# Patient Record
Sex: Male | Born: 1940 | Hispanic: No | Marital: Married | Smoking: Former smoker
Health system: Southern US, Community
[De-identification: ages and names within clinical notes are randomized; demographics above are authoritative.]

## PROBLEM LIST (undated history)

## (undated) DIAGNOSIS — I10 Essential (primary) hypertension: Secondary | ICD-10-CM

## (undated) DIAGNOSIS — K219 Gastro-esophageal reflux disease without esophagitis: Secondary | ICD-10-CM

## (undated) DIAGNOSIS — E079 Disorder of thyroid, unspecified: Secondary | ICD-10-CM

## (undated) DIAGNOSIS — F419 Anxiety disorder, unspecified: Secondary | ICD-10-CM

## (undated) DIAGNOSIS — E119 Type 2 diabetes mellitus without complications: Secondary | ICD-10-CM

## (undated) DIAGNOSIS — I2510 Atherosclerotic heart disease of native coronary artery without angina pectoris: Secondary | ICD-10-CM

## (undated) DIAGNOSIS — IMO0001 Reserved for inherently not codable concepts without codable children: Secondary | ICD-10-CM

## (undated) HISTORY — DX: Gastro-esophageal reflux disease without esophagitis: K21.9

## (undated) HISTORY — DX: Type 2 diabetes mellitus without complications: E11.9

## (undated) HISTORY — DX: Reserved for inherently not codable concepts without codable children: IMO0001

## (undated) HISTORY — DX: Anxiety disorder, unspecified: F41.9

## (undated) HISTORY — PX: CORONARY ANGIOPLASTY: SHX604

---

## 1998-10-30 HISTORY — PX: CHOLECYSTECTOMY: SHX55

## 2006-05-17 ENCOUNTER — Ambulatory Visit: Payer: Self-pay | Admitting: Gastroenterology

## 2006-08-07 ENCOUNTER — Ambulatory Visit: Payer: Self-pay | Admitting: Nurse Practitioner

## 2006-08-08 ENCOUNTER — Ambulatory Visit: Payer: Self-pay | Admitting: Gastroenterology

## 2007-01-14 ENCOUNTER — Other Ambulatory Visit: Payer: Self-pay

## 2007-01-14 ENCOUNTER — Inpatient Hospital Stay: Payer: Self-pay | Admitting: Cardiology

## 2007-03-27 ENCOUNTER — Ambulatory Visit: Payer: Self-pay | Admitting: Internal Medicine

## 2009-03-08 ENCOUNTER — Ambulatory Visit: Payer: Self-pay | Admitting: Gastroenterology

## 2009-09-02 ENCOUNTER — Ambulatory Visit: Payer: Self-pay | Admitting: Ophthalmology

## 2009-09-02 ENCOUNTER — Ambulatory Visit: Payer: Self-pay | Admitting: Cardiology

## 2009-09-14 ENCOUNTER — Ambulatory Visit: Payer: Self-pay | Admitting: Ophthalmology

## 2009-12-08 ENCOUNTER — Ambulatory Visit: Payer: Self-pay | Admitting: Ophthalmology

## 2009-12-20 ENCOUNTER — Ambulatory Visit: Payer: Self-pay | Admitting: Ophthalmology

## 2010-06-03 ENCOUNTER — Emergency Department: Payer: Self-pay | Admitting: Emergency Medicine

## 2013-07-09 ENCOUNTER — Ambulatory Visit: Payer: Self-pay | Admitting: Cardiology

## 2015-04-28 ENCOUNTER — Encounter: Payer: Self-pay | Admitting: Urology

## 2015-04-28 ENCOUNTER — Ambulatory Visit (INDEPENDENT_AMBULATORY_CARE_PROVIDER_SITE_OTHER): Payer: Medicare Other | Admitting: Urology

## 2015-04-28 VITALS — BP 113/58 | HR 67 | Resp 18 | Ht 76.0 in | Wt 191.3 lb

## 2015-04-28 DIAGNOSIS — N400 Enlarged prostate without lower urinary tract symptoms: Secondary | ICD-10-CM

## 2015-04-28 LAB — URINALYSIS, COMPLETE
Bilirubin, UA: NEGATIVE
LEUKOCYTES UA: NEGATIVE
Nitrite, UA: NEGATIVE
RBC, UA: NEGATIVE
SPEC GRAV UA: 1.02 (ref 1.005–1.030)
UUROB: 0.2 mg/dL (ref 0.2–1.0)
pH, UA: 5 (ref 5.0–7.5)

## 2015-04-28 LAB — MICROSCOPIC EXAMINATION
BACTERIA UA: NONE SEEN
RBC MICROSCOPIC, UA: NONE SEEN /HPF (ref 0–?)

## 2015-04-28 LAB — BLADDER SCAN AMB NON-IMAGING

## 2015-04-28 NOTE — Progress Notes (Signed)
Urology History and Physical Exam  CC: Difficulty urinating  HPI: 74 year old male presents w/ c/o LUTS, ED. He has nocturia x 2, intermittency, decreased FOS, hesitancy and feeling of incomplete emptying. No prior h/o UTI, treatment for BPH, blood in urine. This is his 1st GU visit. He also has ED--last erection 1995. He tried PDE%I's years ago w/o success.  PMH: Past Medical History  Diagnosis Date  . Anxiety   . Diabetes mellitus without complication   . Reflux     PSH: Past Surgical History  Procedure Laterality Date  . Cholecystectomy  2000    Allergies: No Known Allergies  Medications:  (Not in a hospital admission)   Social History: History   Social History  . Marital Status: Married    Spouse Name: N/A  . Number of Children: N/A  . Years of Education: N/A   Occupational History  . Not on file.   Social History Main Topics  . Smoking status: Former Smoker -- 30 years    Types: Cigarettes  . Smokeless tobacco: Not on file  . Alcohol Use: No  . Drug Use: Yes  . Sexual Activity: Not on file   Other Topics Concern  . Not on file   Social History Narrative  . No narrative on file    Family History: Family History  Problem Relation Age of Onset  . Prostate cancer Brother   . Prostate cancer Brother     Review of Systems: Positive: Urinary frequency,urgency, nocturia, incontinence, intermittency,ED, diarrhea, wt loss, anxiety Negative:   A further 10 point review of systems was negative except what is listed in the HPI.                  Physical Exam: Filed Vitals:   04/28/15 1605  Height: 6\' 4"  (1.93 m)  Weight: 191 lb 4.8 oz (86.773 kg)   General: No acute distress.  Awake. Head:  Normocephalic.  Atraumatic. ENT:  EOMI.  Mucous membranes moist Neck:  Supple.  No lymphadenopathy. Abdomen: Soft.  Non tender to palpation. Umbilical hernia Skin:  Normal turgor.  No visible rash. Extremity: No gross deformity of bilateral upper extremities.   No gross deformity of                             lower extremities. Neurologic: Alert. Appropriate mood.  Penis:  Uncircumcised.  No lesions. Urethra: Orthotopic meatus. Scrotum: No lesions.  No ecchymosis.  No erythema. Testicles: Descended bilaterally.  No masses bilaterally.Atrophic Epididymis: Palpable bilaterally. Nontender to palpation.  Studies:  Urinalysis is clear  IPSS--9  QOL score 3  PV U/S volume 15 cc  Assessment:  1. BPH--moderately symptomatic--he empties well  2. 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 I have reassured pt about his exam.   2. I suggested that with his multiple meds he consider no symptomatic mgmt of voiding symptome=s--he agrees w/ this  3. PSA today --he has family h/o PCA  4. OV here in 1 year

## 2015-04-29 LAB — PSA: Prostate Specific Ag, Serum: 2.1 ng/mL (ref 0.0–4.0)

## 2015-05-04 ENCOUNTER — Ambulatory Visit
Admission: RE | Admit: 2015-05-04 | Discharge: 2015-05-04 | Disposition: A | Discharge status: Home / Self Care | Payer: Medicare Other | Source: Ambulatory Visit | Attending: Family Medicine | Admitting: Family Medicine

## 2015-05-04 ENCOUNTER — Other Ambulatory Visit: Payer: Self-pay | Admitting: Family Medicine

## 2015-05-04 DIAGNOSIS — J9811 Atelectasis: Secondary | ICD-10-CM | POA: Diagnosis not present

## 2015-05-04 DIAGNOSIS — R634 Abnormal weight loss: Secondary | ICD-10-CM

## 2015-05-11 ENCOUNTER — Other Ambulatory Visit: Payer: Self-pay | Admitting: Family Medicine

## 2015-05-11 DIAGNOSIS — IMO0002 Reserved for concepts with insufficient information to code with codable children: Secondary | ICD-10-CM

## 2015-05-11 DIAGNOSIS — R634 Abnormal weight loss: Secondary | ICD-10-CM

## 2015-05-14 ENCOUNTER — Ambulatory Visit
Admission: RE | Admit: 2015-05-14 | Discharge: 2015-05-14 | Disposition: A | Discharge status: Home / Self Care | Payer: Medicare Other | Source: Ambulatory Visit | Attending: Family Medicine | Admitting: Family Medicine

## 2015-05-14 DIAGNOSIS — E119 Type 2 diabetes mellitus without complications: Secondary | ICD-10-CM | POA: Insufficient documentation

## 2015-05-14 DIAGNOSIS — K861 Other chronic pancreatitis: Secondary | ICD-10-CM | POA: Diagnosis not present

## 2015-05-14 DIAGNOSIS — E0590 Thyrotoxicosis, unspecified without thyrotoxic crisis or storm: Secondary | ICD-10-CM | POA: Insufficient documentation

## 2015-05-14 DIAGNOSIS — R918 Other nonspecific abnormal finding of lung field: Secondary | ICD-10-CM | POA: Insufficient documentation

## 2015-05-14 DIAGNOSIS — R634 Abnormal weight loss: Secondary | ICD-10-CM | POA: Diagnosis present

## 2015-05-14 DIAGNOSIS — Z9861 Coronary angioplasty status: Secondary | ICD-10-CM | POA: Diagnosis not present

## 2015-05-14 DIAGNOSIS — IMO0002 Reserved for concepts with insufficient information to code with codable children: Secondary | ICD-10-CM

## 2015-05-14 DIAGNOSIS — I2510 Atherosclerotic heart disease of native coronary artery without angina pectoris: Secondary | ICD-10-CM | POA: Insufficient documentation

## 2015-05-14 MED ORDER — IOHEXOL 300 MG/ML  SOLN
75.0000 mL | Freq: Once | INTRAMUSCULAR | Status: AC | PRN
  Administered 2015-05-14: 75 mL via INTRAVENOUS

## 2015-06-04 ENCOUNTER — Other Ambulatory Visit: Payer: Self-pay | Admitting: Family Medicine

## 2015-06-04 DIAGNOSIS — E049 Nontoxic goiter, unspecified: Secondary | ICD-10-CM

## 2015-06-10 ENCOUNTER — Ambulatory Visit
Admission: RE | Admit: 2015-06-10 | Discharge: 2015-06-10 | Disposition: A | Discharge status: Home / Self Care | Payer: Medicare Other | Source: Ambulatory Visit | Attending: Family Medicine | Admitting: Family Medicine

## 2015-06-10 DIAGNOSIS — E049 Nontoxic goiter, unspecified: Secondary | ICD-10-CM | POA: Insufficient documentation

## 2015-06-29 ENCOUNTER — Ambulatory Visit: Payer: Medicare Other | Admitting: Certified Registered Nurse Anesthetist

## 2015-06-29 ENCOUNTER — Encounter
Admission: RE | Disposition: A | Discharge status: Home Health | Payer: Self-pay | Source: Ambulatory Visit | Attending: Gastroenterology

## 2015-06-29 ENCOUNTER — Encounter: Payer: Self-pay | Admitting: *Deleted

## 2015-06-29 ENCOUNTER — Ambulatory Visit
Admission: RE | Admit: 2015-06-29 | Discharge: 2015-06-29 | Disposition: A | Discharge status: Home Health | Payer: Medicare Other | Source: Ambulatory Visit | Attending: Gastroenterology | Admitting: Gastroenterology

## 2015-06-29 DIAGNOSIS — I10 Essential (primary) hypertension: Secondary | ICD-10-CM | POA: Insufficient documentation

## 2015-06-29 DIAGNOSIS — Z7982 Long term (current) use of aspirin: Secondary | ICD-10-CM | POA: Insufficient documentation

## 2015-06-29 DIAGNOSIS — K5730 Diverticulosis of large intestine without perforation or abscess without bleeding: Secondary | ICD-10-CM | POA: Insufficient documentation

## 2015-06-29 DIAGNOSIS — E109 Type 1 diabetes mellitus without complications: Secondary | ICD-10-CM | POA: Insufficient documentation

## 2015-06-29 DIAGNOSIS — Z86010 Personal history of colonic polyps: Secondary | ICD-10-CM | POA: Diagnosis not present

## 2015-06-29 DIAGNOSIS — I2510 Atherosclerotic heart disease of native coronary artery without angina pectoris: Secondary | ICD-10-CM | POA: Diagnosis not present

## 2015-06-29 DIAGNOSIS — Z794 Long term (current) use of insulin: Secondary | ICD-10-CM | POA: Insufficient documentation

## 2015-06-29 DIAGNOSIS — B3781 Candidal esophagitis: Secondary | ICD-10-CM | POA: Diagnosis not present

## 2015-06-29 DIAGNOSIS — K319 Disease of stomach and duodenum, unspecified: Secondary | ICD-10-CM | POA: Diagnosis not present

## 2015-06-29 DIAGNOSIS — R634 Abnormal weight loss: Secondary | ICD-10-CM | POA: Insufficient documentation

## 2015-06-29 DIAGNOSIS — F419 Anxiety disorder, unspecified: Secondary | ICD-10-CM | POA: Insufficient documentation

## 2015-06-29 DIAGNOSIS — D120 Benign neoplasm of cecum: Secondary | ICD-10-CM | POA: Insufficient documentation

## 2015-06-29 DIAGNOSIS — Z87891 Personal history of nicotine dependence: Secondary | ICD-10-CM | POA: Insufficient documentation

## 2015-06-29 DIAGNOSIS — K529 Noninfective gastroenteritis and colitis, unspecified: Secondary | ICD-10-CM | POA: Diagnosis not present

## 2015-06-29 DIAGNOSIS — K210 Gastro-esophageal reflux disease with esophagitis: Secondary | ICD-10-CM | POA: Diagnosis not present

## 2015-06-29 DIAGNOSIS — K621 Rectal polyp: Secondary | ICD-10-CM | POA: Insufficient documentation

## 2015-06-29 DIAGNOSIS — K2270 Barrett's esophagus without dysplasia: Secondary | ICD-10-CM | POA: Diagnosis present

## 2015-06-29 DIAGNOSIS — K635 Polyp of colon: Secondary | ICD-10-CM | POA: Insufficient documentation

## 2015-06-29 HISTORY — PX: COLONOSCOPY WITH PROPOFOL: SHX5780

## 2015-06-29 HISTORY — PX: ESOPHAGOGASTRODUODENOSCOPY (EGD) WITH PROPOFOL: SHX5813

## 2015-06-29 LAB — KOH PREP

## 2015-06-29 LAB — GLUCOSE, CAPILLARY: Glucose-Capillary: 131 mg/dL — ABNORMAL HIGH (ref 65–99)

## 2015-06-29 SURGERY — ESOPHAGOGASTRODUODENOSCOPY (EGD) WITH PROPOFOL
Anesthesia: General

## 2015-06-29 MED ORDER — SODIUM CHLORIDE 0.9 % IV SOLN: INTRAVENOUS | Status: DC

## 2015-06-29 MED ORDER — LIDOCAINE HCL (CARDIAC) 20 MG/ML IV SOLN
INTRAVENOUS | Status: DC | PRN
  Administered 2015-06-29: 20 mg via INTRAVENOUS

## 2015-06-29 MED ORDER — PROPOFOL INFUSION 10 MG/ML OPTIME
INTRAVENOUS | Status: DC | PRN
  Administered 2015-06-29: 150 ug/kg/min via INTRAVENOUS

## 2015-06-29 MED ORDER — EPHEDRINE SULFATE 50 MG/ML IJ SOLN
INTRAMUSCULAR | Status: DC | PRN
  Administered 2015-06-29 (×2): 5 mg via INTRAVENOUS

## 2015-06-29 MED ORDER — SODIUM CHLORIDE 0.9 % IV SOLN
INTRAVENOUS | Status: DC
  Administered 2015-06-29: 10:00:00 via INTRAVENOUS

## 2015-06-29 MED ORDER — GLYCOPYRROLATE 0.2 MG/ML IJ SOLN
INTRAMUSCULAR | Status: DC | PRN
  Administered 2015-06-29 (×2): 0.1 mg via INTRAVENOUS

## 2015-06-29 MED ORDER — PHENYLEPHRINE HCL 10 MG/ML IJ SOLN
INTRAMUSCULAR | Status: DC | PRN
  Administered 2015-06-29: 50 ug via INTRAVENOUS

## 2015-06-29 NOTE — Anesthesia Postprocedure Evaluation (Signed)
  Anesthesia Post-op Note  Patient: Jorge Bradford  Procedure(s) Performed: Procedure(s): ESOPHAGOGASTRODUODENOSCOPY (EGD) WITH PROPOFOL (N/A) COLONOSCOPY WITH PROPOFOL (N/A)  Anesthesia type:General  Patient location: PACU  Post pain: Pain level controlled  Post assessment: Post-op Vital signs reviewed, Patient's Cardiovascular Status Stable, Respiratory Function Stable, Patent Airway and No signs of Nausea or vomiting  Post vital signs: Reviewed and stable  Last Vitals:  Filed Vitals:   06/29/15 0941  BP: 118/61  Pulse: 68  Temp: 36.7 C  Resp: 14    Level of consciousness: awake, alert  and patient cooperative  Complications: No apparent anesthesia complications

## 2015-06-29 NOTE — H&P (Signed)
Outpatient short stay form Pre-procedure 06/29/2015 10:53 AM Lollie Sails MD  Primary Physician: Dr. Veda Canning  Reason for visit:  EGD and colonoscopy  History of present illness:  Patient is a heavy 74-year-old male presenting for UA showed regards to abnormal weight loss. He also has a personal history of Barrett's esophagus. His had no GI symptoms. Does have a personal history of adenomatous colon polyps as well.  He takes no anticoagulation medications for the exception of a 81 mg aspirin daily. He takes no over-the-counter or aspirin products otherwise.    Current facility-administered medications:  .  0.9 %  sodium chloride infusion, , Intravenous, Continuous, Lollie Sails, MD, Last Rate: 20 mL/hr at 06/29/15 1008 .  0.9 %  sodium chloride infusion, , Intravenous, Continuous, Lollie Sails, MD  Prescriptions prior to admission  Medication Sig Dispense Refill Last Dose  . aspirin EC 81 MG tablet Take 81 mg by mouth daily.   06/28/2015 at Unknown time  . enalapril (VASOTEC) 10 MG tablet Take 10 mg by mouth daily.    06/28/2015 at Unknown time  . furosemide (LASIX) 20 MG tablet    06/28/2015 at Unknown time  . gabapentin (NEURONTIN) 600 MG tablet    06/28/2015 at Unknown time  . lovastatin (MEVACOR) 40 MG tablet    06/28/2015 at Unknown time  . metFORMIN (GLUCOPHAGE-XR) 500 MG 24 hr tablet    06/28/2015 at Unknown time  . metoprolol succinate (TOPROL-XL) 25 MG 24 hr tablet    06/29/2015 at 0600  . omeprazole (PRILOSEC) 20 MG capsule    06/28/2015 at Unknown time     No Known Allergies   Past Medical History  Diagnosis Date  . Anxiety   . Diabetes mellitus without complication   . Reflux     Review of systems:      Physical Exam    Heart and lungs: Regular rate and rhythm without rub or gallop, lungs are bilaterally clear    HEENT: Normocephalic atraumatic eyes are anicteric    Other:     Pertinant exam for procedure: Soft nontender nondistended bowel  sounds positive normoactive. There is some mild discomfort to palpation in the left lower quadrant. No rebound    Planned proceedures: EGD and colonoscopy with indicated procedures I have discussed the risks benefits and complications of procedures to include not limited to bleeding, infection, perforation and the risk of sedation and the patient wishes to proceed.    Lollie Sails, MD Gastroenterology 06/29/2015  10:53 AM

## 2015-06-29 NOTE — Anesthesia Postprocedure Evaluation (Signed)
  Anesthesia Post-op Note  Patient: Jorge Bradford  Procedure(s) Performed: Procedure(s): ESOPHAGOGASTRODUODENOSCOPY (EGD) WITH PROPOFOL (N/A) COLONOSCOPY WITH PROPOFOL (N/A)  Anesthesia type:General  Patient location: PACU  Post pain: Pain level controlled  Post assessment: Post-op Vital signs reviewed, Patient's Cardiovascular Status Stable, Respiratory Function Stable, Patent Airway and No signs of Nausea or vomiting  Post vital signs: Reviewed and stable  Last Vitals:  Filed Vitals:   06/29/15 1224  BP: 130/75  Pulse: 77  Temp:   Resp: 14    Level of consciousness: awake, alert  and patient cooperative  Complications: No apparent anesthesia complications

## 2015-06-29 NOTE — Op Note (Signed)
[facility] Gastroenterology Patient Name: Jorge Bradford Procedure Date: 06/29/2015 10:54 AM MRN: [id number] Account #: [id number] Date of Birth: September 05, 1941 Admit Type: Outpatient Age: 74 Room: [facility] ENDO ROOM 3 Gender: Male Note Status: Finalized Procedure:         Colonoscopy Indications:       Personal history of colonic polyps, Weight loss Providers:         Lollie Sails, MD Referring MD:      Denton Lank, MD (Referring MD) Medicines:         Monitored Anesthesia Care Complications:     No immediate complications. Procedure:         Pre-Anesthesia Assessment:                    - ASA Grade Assessment: III - A patient with severe                     systemic disease.                    After obtaining informed consent, the colonoscope was                     passed under direct vision. Throughout the procedure, the                     patient's blood pressure, pulse, and oxygen saturations                     were monitored continuously. The Colonoscope was                     introduced through the anus and advanced to the the cecum,                     identified by appendiceal orifice and ileocecal valve. The                     colonoscopy was unusually difficult due to poor bowel prep                     and a tortuous colon. Successful completion of the                     procedure was aided by changing the patient to a supine                     position, changing the patient to a prone position and                     lavage. The patient tolerated the procedure well. The                     quality of the bowel preparation was fair. Findings:      A 2 mm polyp was found in the descending colon. The polyp was sessile.       The polyp was removed with a cold biopsy forceps. Resection and       retrieval were complete.      A 2 mm polyp was found in the transverse colon. The polyp was sessile.       The polyp was removed with a cold biopsy forceps.  Resection and       retrieval were complete.  A 3 mm polyp was found in the cecum. The polyp was sessile. The polyp       was removed with a cold biopsy forceps. Resection and retrieval were       complete.      A 3 mm polyp was found in the descending colon. The polyp was sessile.       The polyp was removed with a cold biopsy forceps. Resection and       retrieval were complete.      A 3 mm polyp was found in the rectum. The polyp was sessile. The polyp       was removed with a cold biopsy forceps. Resection and retrieval were       complete.      Multiple medium-mouthed diverticula were found in the sigmoid colon and       in the distal descending colon.      The digital rectal exam was normal. Impression:        - One 2 mm polyp in the descending colon. Resected and                     retrieved.                    - One 2 mm polyp in the transverse colon. Resected and                     retrieved.                    - One 3 mm polyp in the cecum. Resected and retrieved.                    - One 3 mm polyp in the descending colon. Resected and                     retrieved.                    - One 3 mm polyp in the rectum. Resected and retrieved.                    - Diverticulosis in the sigmoid colon and in the distal                     descending colon. Recommendation:    - Await pathology results.                    - Telephone GI clinic for pathology results in 1 week. Procedure Code(s): --- Professional ---                    [phone], Colonoscopy, flexible; with biopsy, single or                     multiple Diagnosis Code(s): --- Professional ---                    211.3, Benign neoplasm of colon                    569.0, Anal and rectal polyp                    V12.72, Personal history of colonic polyps  783.21, Loss of weight                    562.10, Diverticulosis of colon (without mention of                     hemorrhage) CPT copyright 2014  American Medical Association. All rights reserved. The codes documented in this report are preliminary and upon coder review may  be revised to meet current compliance requirements. Lollie Sails, MD 06/29/2015 12:10:02 PM This report has been signed electronically. Number of Addenda: 0 Note Initiated On: 06/29/2015 10:54 AM Scope Withdrawal Time: 0 hours 13 minutes 2 seconds  Total Procedure Duration: 0 hours 30 minutes 39 seconds       [facility]

## 2015-06-29 NOTE — Transfer of Care (Signed)
Immediate Anesthesia Transfer of Care Note  Patient: Jorge Bradford  Procedure(s) Performed: Procedure(s): ESOPHAGOGASTRODUODENOSCOPY (EGD) WITH PROPOFOL (N/A) COLONOSCOPY WITH PROPOFOL (N/A)  Patient Location: PACU  Anesthesia Type:General  Level of Consciousness: awake  Airway & Oxygen Therapy: Patient Spontanous Breathing and Patient connected to nasal cannula oxygen  Post-op Assessment: Report given to RN  Post vital signs: stable  Last Vitals:  Filed Vitals:   06/29/15 0941  BP: 118/61  Pulse: 68  Temp: 36.7 C  Resp: 14    Complications: No apparent anesthesia complications

## 2015-06-29 NOTE — Anesthesia Preprocedure Evaluation (Signed)
Anesthesia Evaluation  Patient identified by MRN, date of birth, ID band Patient awake    Reviewed: Allergy & Precautions, NPO status , Patient's Chart, lab work & pertinent test results  History of Anesthesia Complications Negative for: history of anesthetic complications  Airway Mallampati: II       Dental   Pulmonary former smoker,    + decreased breath sounds      Cardiovascular hypertension, Pt. on home beta blockers + CAD Normal cardiovascular exam    Neuro/Psych Anxiety    GI/Hepatic negative GI ROS, Neg liver ROS,   Endo/Other  diabetes, Well Controlled, Type 1, Insulin Dependent  Renal/GU negative Renal ROS     Musculoskeletal   Abdominal Normal abdominal exam  (+)   Peds  Hematology   Anesthesia Other Findings   Reproductive/Obstetrics                             Anesthesia Physical Anesthesia Plan  ASA: III  Anesthesia Plan: General   Post-op Pain Management:    Induction: Intravenous  Airway Management Planned: Nasal Cannula  Additional Equipment:   Intra-op Plan:   Post-operative Plan:   Informed Consent: I have reviewed the patients History and Physical, chart, labs and discussed the procedure including the risks, benefits and alternatives for the proposed anesthesia with the patient or authorized representative who has indicated his/her understanding and acceptance.     Plan Discussed with: CRNA  Anesthesia Plan Comments:         Anesthesia Quick Evaluation

## 2015-06-29 NOTE — Op Note (Signed)
[facility] Gastroenterology Patient Name: Jorge Bradford Procedure Date: 06/29/2015 10:54 AM MRN: [id number] Account #: [id number] Date of Birth: 10/22/1941 Admit Type: Outpatient Age: 74 Room: [facility] ENDO ROOM 3 Gender: Male Note Status: Finalized Procedure:         Upper GI endoscopy Indications:       Follow-up of Barrett's esophagus, Weight loss Providers:         Lollie Sails, MD Referring MD:      Denton Lank, MD (Referring MD) Medicines:         Monitored Anesthesia Care Complications:     No immediate complications. Procedure:         Pre-Anesthesia Assessment:                    - ASA Grade Assessment: III - A patient with severe                     systemic disease.                    After obtaining informed consent, the endoscope was passed                     under direct vision. Throughout the procedure, the                     patient's blood pressure, pulse, and oxygen saturations                     were monitored continuously. The Endoscope was introduced                     through the mouth, and advanced to the third part of                     duodenum. The patient tolerated the procedure well. The                     upper GI endoscopy was accomplished without difficulty. Findings:      The Z-line was irregular.      There were esophageal mucosal changes secondary to established       short-segment Barrett's disease present at the gastroesophageal       junction. The maximum longitudinal extent of these mucosal changes was 1       cm in length. Mucosa was biopsied with a cold forceps for histology in 4       quadrants.      Possible minimal candidiasis was found in the lower third of the       esophagus. Cells for cytology were obtained by brushing.      The cardia and gastric fundus were normal on retroflexion.      The examined duodenum was normal.      A single 6 mm pedunculated polyp with no bleeding and no stigmata of       recent  bleeding was found on the anterior wall of the gastric body. The       polyp was removed with a cold biopsy forceps. Resection and retrieval       were complete.      A few dispersed, small non-bleeding erosions were found in the       prepyloric region of the stomach. There were no stigmata of recent       bleeding. Biopsies  were taken with a cold forceps for histology. Impression:        - Z-line irregular.                    - Esophageal mucosal changes secondary to established                     short-segment Barrett's disease. Biopsied.                    - Monilial esophagitis. Cells for cytology obtained.                    - Normal examined duodenum. Recommendation:    - Await pathology results.                    - Use Prilosec (omeprazole) 20 mg PO daily daily. Procedure Code(s): --- Professional ---                    [phone], Esophagogastroduodenoscopy, flexible, transoral;                     with biopsy, single or multiple Diagnosis Code(s): --- Professional ---                    530.89, Other specified disorders of esophagus                    530.85, Barrett's esophagus                    112.84, Candidal esophagitis                    783.21, Loss of weight CPT copyright 2014 American Medical Association. All rights reserved. The codes documented in this report are preliminary and upon coder review may  be revised to meet current compliance requirements. Lollie Sails, MD 06/29/2015 11:32:47 AM This report has been signed electronically. Number of Addenda: 0 Note Initiated On: 06/29/2015 10:54 AM      [facility]

## 2015-07-01 LAB — SURGICAL PATHOLOGY

## 2015-07-07 ENCOUNTER — Other Ambulatory Visit: Payer: Self-pay | Admitting: Otolaryngology

## 2015-07-07 DIAGNOSIS — E041 Nontoxic single thyroid nodule: Secondary | ICD-10-CM

## 2015-07-08 ENCOUNTER — Other Ambulatory Visit: Payer: Self-pay | Admitting: Radiology

## 2015-07-09 ENCOUNTER — Ambulatory Visit
Admission: RE | Admit: 2015-07-09 | Discharge: 2015-07-09 | Disposition: A | Discharge status: Home / Self Care | Payer: Medicare Other | Source: Ambulatory Visit | Attending: Otolaryngology | Admitting: Otolaryngology

## 2015-07-09 DIAGNOSIS — E041 Nontoxic single thyroid nodule: Secondary | ICD-10-CM | POA: Diagnosis present

## 2015-07-09 HISTORY — DX: Essential (primary) hypertension: I10

## 2015-07-09 HISTORY — DX: Disorder of thyroid, unspecified: E07.9

## 2015-07-09 HISTORY — DX: Atherosclerotic heart disease of native coronary artery without angina pectoris: I25.10

## 2015-07-09 NOTE — Procedures (Signed)
U/S Thyroid Biopsy  Complications:  None  Blood Loss: none  See dictation in canopy pacs

## 2015-07-09 NOTE — Discharge Instructions (Signed)
Thyroid Biopsy °The thyroid gland is a butterfly-shaped gland situated in the front of the neck. It produces hormones which affect metabolism, growth and development, and body temperature. A thyroid biopsy is a procedure in which small samples of tissue or fluid are removed from the thyroid gland or mass and examined under a microscope. This test is done to determine the cause of thyroid problems, such as infection, cancer, or other thyroid problems. °There are 2 ways to obtain samples: °1. Fine needle biopsy. Samples are removed using a thin needle inserted through the skin and into the thyroid gland or mass. °2. Open biopsy. Samples are removed after a cut (incision) is made through the skin. °LET YOUR CAREGIVER KNOW ABOUT:  °· Allergies. °· Medications taken including herbs, eye drops, over-the-counter medications, and creams. °· Use of steroids (by mouth or creams). °· Previous problems with anesthetics or numbing medicine. °· Possibility of pregnancy, if this applies. °· History of blood clots (thrombophlebitis). °· History of bleeding or blood problems. °· Previous surgery. °· Other health problems. °RISKS AND COMPLICATIONS °· Bleeding from the site. The risk of bleeding is higher if you have a bleeding disorder or are taking any blood thinning medications (anticoagulants). °· Infection. °· Injury to structures near the thyroid gland. °BEFORE THE PROCEDURE  °This is a procedure that can be done as an outpatient. Confirm the time that you need to arrive for your procedure. Confirm whether there is a need to fast or withhold any medications. A blood sample may be done to determine your blood clotting time. Medicine may be given to help you relax (sedative). °PROCEDURE °Fine needle biopsy. °You will be awake during the procedure. You may be asked to lie on your back with your head tipped backward to extend your neck. Let your caregiver know if you cannot tolerate the positioning. An area on your neck will be  cleansed. A needle is inserted through the skin of your neck. You may feel a mild discomfort during this procedure. You may be asked to avoid coughing, talking, swallowing, or making sounds during some portions of the procedure. The needle is withdrawn once tissue or fluid samples have been removed. Pressure may be applied to the neck to reduce swelling and ensure that bleeding has stopped. The samples will be sent for examination.  °Open biopsy. °You will be given general anesthesia. You will be asleep during the procedure. An incision is made in your neck. A sample of thyroid tissue or the mass is removed. The tissue sample or mass will be sent for examination. The sample or mass may be examined during the biopsy. If the sample or mass contains cancer cells, some or all of the thyroid gland may be removed. The incision is closed with stitches. °AFTER THE PROCEDURE  °Your recovery will be assessed and monitored. If there are no problems, as an outpatient, you should be able to go home shortly after the procedure. °If you had a fine needle biopsy: °· You may have soreness at the biopsy site for 1 to 2 days. °If you had an open biopsy:  °· You may have soreness at the biopsy site for 3 to 4 days. °· You may have a hoarse voice or sore throat for 1 to 2 days. °Obtaining the Test Results °It is your responsibility to obtain your test results. Do not assume everything is normal if you have not heard from your caregiver or the medical facility. It is important for you to follow up   on all of your test results. °HOME CARE INSTRUCTIONS  °· Keeping your head raised on a pillow when you are lying down may ease biopsy site discomfort. °· Supporting the back of your head and neck with both hands as you sit up from a lying position may ease biopsy site discomfort. °· Only take over-the-counter or prescription medicines for pain, discomfort, or fever as directed by your caregiver. °· Throat lozenges or gargling with warm salt  water may help to soothe a sore throat. °SEEK IMMEDIATE MEDICAL CARE IF:  °· You have severe bleeding from the biopsy site. °· You have difficulty swallowing. °· You have a fever. °· You have increased pain, swelling, redness, or warmth at the biopsy site. °· You notice pus coming from the biopsy site. °· You have swollen glands (lymph nodes) in your neck. °Document Released: 08/13/2007 Document Revised: 02/10/2013 Document Reviewed: 01/08/2014 °[employer]® Patient Information ©2015 [employer]. This information is not intended to replace advice given to you by your health care provider. Make sure you discuss any questions you have with your health care provider. ° °

## 2015-07-12 LAB — CYTOLOGY - NON PAP

## 2015-09-30 ENCOUNTER — Other Ambulatory Visit: Payer: Self-pay | Admitting: *Deleted

## 2016-04-27 ENCOUNTER — Ambulatory Visit: Payer: Self-pay

## 2017-01-07 ENCOUNTER — Emergency Department
Admission: EM | Admit: 2017-01-07 | Discharge: 2017-01-07 | Disposition: A | Discharge status: Home / Self Care | Payer: Medicare Other | Attending: Emergency Medicine | Admitting: Emergency Medicine

## 2017-01-07 ENCOUNTER — Encounter: Payer: Self-pay | Admitting: Emergency Medicine

## 2017-01-07 DIAGNOSIS — E119 Type 2 diabetes mellitus without complications: Secondary | ICD-10-CM | POA: Diagnosis not present

## 2017-01-07 DIAGNOSIS — Z79899 Other long term (current) drug therapy: Secondary | ICD-10-CM | POA: Insufficient documentation

## 2017-01-07 DIAGNOSIS — R112 Nausea with vomiting, unspecified: Secondary | ICD-10-CM | POA: Diagnosis present

## 2017-01-07 DIAGNOSIS — Z794 Long term (current) use of insulin: Secondary | ICD-10-CM | POA: Diagnosis not present

## 2017-01-07 DIAGNOSIS — I2510 Atherosclerotic heart disease of native coronary artery without angina pectoris: Secondary | ICD-10-CM | POA: Diagnosis not present

## 2017-01-07 DIAGNOSIS — Z7982 Long term (current) use of aspirin: Secondary | ICD-10-CM | POA: Diagnosis not present

## 2017-01-07 DIAGNOSIS — I10 Essential (primary) hypertension: Secondary | ICD-10-CM | POA: Insufficient documentation

## 2017-01-07 DIAGNOSIS — K529 Noninfective gastroenteritis and colitis, unspecified: Secondary | ICD-10-CM

## 2017-01-07 DIAGNOSIS — Z87891 Personal history of nicotine dependence: Secondary | ICD-10-CM | POA: Insufficient documentation

## 2017-01-07 LAB — COMPREHENSIVE METABOLIC PANEL
ALBUMIN: 3.5 g/dL (ref 3.5–5.0)
ALT: 18 U/L (ref 17–63)
AST: 27 U/L (ref 15–41)
Alkaline Phosphatase: 60 U/L (ref 38–126)
Anion gap: 7 (ref 5–15)
BUN: 23 mg/dL — AB (ref 6–20)
CHLORIDE: 102 mmol/L (ref 101–111)
CO2: 26 mmol/L (ref 22–32)
CREATININE: 1.24 mg/dL (ref 0.61–1.24)
Calcium: 8.8 mg/dL — ABNORMAL LOW (ref 8.9–10.3)
GFR calc Af Amer: >60 mL/min (ref >60)
GFR, EST NON AFRICAN AMERICAN: 55 mL/min — AB (ref >60)
GLUCOSE: 319 mg/dL — AB (ref 65–99)
POTASSIUM: 4.4 mmol/L (ref 3.5–5.1)
SODIUM: 135 mmol/L (ref 135–145)
Total Bilirubin: 1 mg/dL (ref 0.3–1.2)
Total Protein: 6.2 g/dL — ABNORMAL LOW (ref 6.5–8.1)

## 2017-01-07 LAB — URINALYSIS, COMPLETE (UACMP) WITH MICROSCOPIC
BACTERIA UA: NONE SEEN
BILIRUBIN URINE: NEGATIVE
Glucose, UA: >=500 mg/dL — AB
Hgb urine dipstick: NEGATIVE
Ketones, ur: NEGATIVE mg/dL
Leukocytes, UA: NEGATIVE
Nitrite: NEGATIVE
PH: 5 (ref 5.0–8.0)
Protein, ur: NEGATIVE mg/dL
RBC / HPF: NONE SEEN RBC/hpf (ref 0–5)
SPECIFIC GRAVITY, URINE: 1.011 (ref 1.005–1.030)
SQUAMOUS EPITHELIAL / LPF: NONE SEEN

## 2017-01-07 LAB — CBC
HEMATOCRIT: 41.5 % (ref 40.0–52.0)
Hemoglobin: 14.2 g/dL (ref 13.0–18.0)
MCH: 30.6 pg (ref 26.0–34.0)
MCHC: 34.2 g/dL (ref 32.0–36.0)
MCV: 89.2 fL (ref 80.0–100.0)
PLATELETS: 231 10*3/uL (ref 150–440)
RBC: 4.65 MIL/uL (ref 4.40–5.90)
RDW: 14.9 % — ABNORMAL HIGH (ref 11.5–14.5)
WBC: 11.7 10*3/uL — ABNORMAL HIGH (ref 3.8–10.6)

## 2017-01-07 LAB — LIPASE, BLOOD

## 2017-01-07 MED ORDER — SODIUM CHLORIDE 0.9 % IV BOLUS (SEPSIS)
1000.0000 mL | Freq: Once | INTRAVENOUS | Status: AC
  Administered 2017-01-07: 1000 mL via INTRAVENOUS

## 2017-01-07 MED ORDER — ONDANSETRON 4 MG PO TBDP: 4.0000 mg | ORAL_TABLET | Freq: Three times a day (TID) | ORAL | 0 refills | Status: DC | PRN

## 2017-01-07 MED ORDER — ONDANSETRON HCL 4 MG/2ML IJ SOLN
4.0000 mg | Freq: Once | INTRAMUSCULAR | Status: AC
Start: 2017-01-07 — End: 2017-01-07
  Administered 2017-01-07: 4 mg via INTRAVENOUS
  Filled 2017-01-07: qty 2

## 2017-01-07 MED ORDER — LOPERAMIDE HCL 2 MG PO CAPS
4.0000 mg | ORAL_CAPSULE | Freq: Once | ORAL | Status: AC
  Administered 2017-01-07: 4 mg via ORAL
  Filled 2017-01-07: qty 2

## 2017-01-07 NOTE — ED Provider Notes (Signed)
[facility] Emergency Department Provider Note  Time seen: 8:51 AM  I have reviewed the triage vital signs and the nursing notes.   HISTORY  Chief Complaint Emesis and Diarrhea    HPI Jorge Bradford is a 76 y.o. male With a past medical history of anxiety, diabetes, hypertension, presents the emergency department with nausea vomiting and diarrhea. According to the patient is symptoms started approximately 36 hours ago. States he has not been able to keep down any liquids or food for the past 36 hours. Continues to have vomiting and diarrhea this morning so he came to the emergency department for evaluation. Denies any abdominal pain. Denies any fever.  Past Medical History:  Diagnosis Date  . Anxiety   . Coronary artery disease   . Diabetes mellitus without complication ([place])   . Hypertension   . Reflux   . Thyroid disease     There are no active problems to display for this patient.   Past Surgical History:  Procedure Laterality Date  . CHOLECYSTECTOMY  2000  . CHOLECYSTECTOMY    . COLONOSCOPY WITH PROPOFOL N/A 06/29/2015   Procedure: COLONOSCOPY WITH PROPOFOL;  Surgeon: Lollie Sails, MD;  Location: [facility] ENDOSCOPY;  Service: Endoscopy;  Laterality: N/A;  . CORONARY ANGIOPLASTY    . ESOPHAGOGASTRODUODENOSCOPY (EGD) WITH PROPOFOL N/A 06/29/2015   Procedure: ESOPHAGOGASTRODUODENOSCOPY (EGD) WITH PROPOFOL;  Surgeon: Lollie Sails, MD;  Location: [facility] ENDOSCOPY;  Service: Endoscopy;  Laterality: N/A;    Prior to Admission medications   Medication Sig Start Date End Date Taking? Authorizing Provider  aspirin EC 81 MG tablet Take 81 mg by mouth every evening.     Historical Provider, MD  enalapril (VASOTEC) 10 MG tablet Take 10 mg by mouth daily.  04/05/15   Historical Provider, MD  furosemide (LASIX) 20 MG tablet Take 20 mg by mouth daily.  02/18/15   Historical Provider, MD  gabapentin (NEURONTIN) 600 MG tablet Take 600 mg by mouth 2 (two) times  daily.  04/20/15   Historical Provider, MD  insulin detemir (LEVEMIR) 100 UNIT/ML injection Inject 27 Units into the skin 2 (two) times daily. 22U in the evening    Historical Provider, MD  lovastatin (MEVACOR) 40 MG tablet Take 40 mg by mouth at bedtime.  04/05/15   Historical Provider, MD  metFORMIN (GLUCOPHAGE-XR) 500 MG 24 hr tablet Take 500 mg by mouth daily with breakfast.  03/04/15   Historical Provider, MD  metoprolol succinate (TOPROL-XL) 25 MG 24 hr tablet Take 25 mg by mouth daily.  03/26/15   Historical Provider, MD  omeprazole (PRILOSEC) 20 MG capsule Take 20 mg by mouth daily.  03/17/15   Historical Provider, MD    No Known Allergies  Family History  Problem Relation Age of Onset  . Prostate cancer Brother   . Prostate cancer Brother     Social History Social History  Substance Use Topics  . Smoking status: Former Smoker    Packs/day: 3.00    Years: 30.00    Types: Cigarettes    Quit date: 10/30/1990  . Smokeless tobacco: Never Used  . Alcohol use No    Review of Systems Constitutional: Negative for fever Cardiovascular: Negative for chest pain. Respiratory: Negative for shortness of breath. Gastrointestinal: Negative for abdominal pain. Positive for nausea vomiting and diarrhea. Genitourinary: Negative for dysuria. Neurological: Negative for headache 10-point ROS otherwise negative.  ____________________________________________   PHYSICAL EXAM:  VITAL SIGNS: ED Triage Vitals  Enc Vitals Group  BP 01/07/17 0844 (!) 141/106     Pulse Rate 01/07/17 0844 83     Resp 01/07/17 0844 16     Temp 01/07/17 0844 98.2 F (36.8 C)     Temp Source 01/07/17 0844 Oral     SpO2 01/07/17 0844 97 %     Weight 01/07/17 0843 180 lb (81.6 kg)     Height 01/07/17 0843 6\' 4"  (1.93 m)     Head Circumference --      Peak Flow --      Pain Score 01/07/17 0843 0     Pain Loc --      Pain Edu? --      Excl. in [place]? --     Constitutional: Alert and oriented. Well appearing and  in no distress. Eyes: Normal exam ENT   Head: Normocephalic and atraumatic.   Mouth/Throat: Dry mucous membranes. Cardiovascular: Normal rate, regular rhythm. No murmur Respiratory: Normal respiratory effort without tachypnea nor retractions. Breath sounds are clear  Gastrointestinal: Soft and nontender. No distention.  Musculoskeletal: Nontender with normal range of motion in all extremities. Neurologic:  Normal speech and language. No gross focal neurologic deficits  Skin:  Skin is warm, dry and intact.  Psychiatric: Mood and affect are normal.   ____________________________________________   INITIAL IMPRESSION / ASSESSMENT AND PLAN / ED COURSE  Pertinent labs & imaging results that were available during my care of the patient were reviewed by me and considered in my medical decision making (see chart for details).  atient presents to the emergency department nausea vomiting and diarrhea for the past 36 hours. Overall the patient appears well, no distress, does have somewhat dry mucous membranes. We will IV hydrate, treat with Zofran and loperamide and continue to closely monitor in the emergency department while awaiting lab results. Highly suspect viral gastroenteritis.  Labs are largely within normal limits. Patient states he is feeling much better after medications and fluids. He has not had any further diarrhea or vomiting since arrival. We will discharge home with Zofran and supportive care. I discussed return precautions. ____________________________________________   FINAL CLINICAL IMPRESSION(S) / ED DIAGNOSES  gastroenteritis    Harvest Dark, MD 01/07/17 1205

## 2017-01-07 NOTE — ED Triage Notes (Signed)
C/O vomiting and diarrhea x 2 days.  CBG:  326.  VS wnl via EMS.

## 2017-03-24 ENCOUNTER — Encounter: Payer: Self-pay | Admitting: Emergency Medicine

## 2017-03-24 ENCOUNTER — Observation Stay
Admission: EM | Admit: 2017-03-24 | Discharge: 2017-03-25 | Disposition: A | Discharge status: Home / Self Care | Payer: Medicare Other | Attending: Internal Medicine | Admitting: Internal Medicine

## 2017-03-24 ENCOUNTER — Emergency Department: Payer: Medicare Other

## 2017-03-24 DIAGNOSIS — Z7982 Long term (current) use of aspirin: Secondary | ICD-10-CM | POA: Insufficient documentation

## 2017-03-24 DIAGNOSIS — Z794 Long term (current) use of insulin: Secondary | ICD-10-CM | POA: Insufficient documentation

## 2017-03-24 DIAGNOSIS — Z9049 Acquired absence of other specified parts of digestive tract: Secondary | ICD-10-CM | POA: Insufficient documentation

## 2017-03-24 DIAGNOSIS — K529 Noninfective gastroenteritis and colitis, unspecified: Principal | ICD-10-CM | POA: Diagnosis present

## 2017-03-24 DIAGNOSIS — Z87891 Personal history of nicotine dependence: Secondary | ICD-10-CM | POA: Diagnosis not present

## 2017-03-24 DIAGNOSIS — R197 Diarrhea, unspecified: Secondary | ICD-10-CM

## 2017-03-24 DIAGNOSIS — K8681 Exocrine pancreatic insufficiency: Secondary | ICD-10-CM | POA: Diagnosis not present

## 2017-03-24 DIAGNOSIS — Z8042 Family history of malignant neoplasm of prostate: Secondary | ICD-10-CM | POA: Insufficient documentation

## 2017-03-24 DIAGNOSIS — E1140 Type 2 diabetes mellitus with diabetic neuropathy, unspecified: Secondary | ICD-10-CM | POA: Insufficient documentation

## 2017-03-24 DIAGNOSIS — I2510 Atherosclerotic heart disease of native coronary artery without angina pectoris: Secondary | ICD-10-CM | POA: Insufficient documentation

## 2017-03-24 DIAGNOSIS — K861 Other chronic pancreatitis: Secondary | ICD-10-CM | POA: Diagnosis not present

## 2017-03-24 DIAGNOSIS — I10 Essential (primary) hypertension: Secondary | ICD-10-CM | POA: Insufficient documentation

## 2017-03-24 DIAGNOSIS — K219 Gastro-esophageal reflux disease without esophagitis: Secondary | ICD-10-CM | POA: Diagnosis not present

## 2017-03-24 DIAGNOSIS — Z8249 Family history of ischemic heart disease and other diseases of the circulatory system: Secondary | ICD-10-CM | POA: Insufficient documentation

## 2017-03-24 DIAGNOSIS — I700 Atherosclerosis of aorta: Secondary | ICD-10-CM | POA: Diagnosis not present

## 2017-03-24 DIAGNOSIS — E785 Hyperlipidemia, unspecified: Secondary | ICD-10-CM | POA: Diagnosis not present

## 2017-03-24 DIAGNOSIS — E039 Hypothyroidism, unspecified: Secondary | ICD-10-CM | POA: Insufficient documentation

## 2017-03-24 DIAGNOSIS — F419 Anxiety disorder, unspecified: Secondary | ICD-10-CM | POA: Insufficient documentation

## 2017-03-24 DIAGNOSIS — R112 Nausea with vomiting, unspecified: Secondary | ICD-10-CM | POA: Diagnosis present

## 2017-03-24 LAB — GASTROINTESTINAL PANEL BY PCR, STOOL (REPLACES STOOL CULTURE)
Adenovirus F40/41: NOT DETECTED
Astrovirus: NOT DETECTED
CAMPYLOBACTER SPECIES: NOT DETECTED
CRYPTOSPORIDIUM: NOT DETECTED
CYCLOSPORA CAYETANENSIS: NOT DETECTED
ENTEROPATHOGENIC E COLI (EPEC): NOT DETECTED
Entamoeba histolytica: NOT DETECTED
Enteroaggregative E coli (EAEC): NOT DETECTED
Enterotoxigenic E coli (ETEC): NOT DETECTED
Giardia lamblia: NOT DETECTED
Norovirus GI/GII: NOT DETECTED
PLESIMONAS SHIGELLOIDES: NOT DETECTED
ROTAVIRUS A: NOT DETECTED
SALMONELLA SPECIES: NOT DETECTED
SAPOVIRUS (I, II, IV, AND V): NOT DETECTED
SHIGA LIKE TOXIN PRODUCING E COLI (STEC): NOT DETECTED
SHIGELLA/ENTEROINVASIVE E COLI (EIEC): NOT DETECTED
VIBRIO SPECIES: NOT DETECTED
Vibrio cholerae: NOT DETECTED
YERSINIA ENTEROCOLITICA: NOT DETECTED

## 2017-03-24 LAB — URINALYSIS, COMPLETE (UACMP) WITH MICROSCOPIC
BILIRUBIN URINE: NEGATIVE
Glucose, UA: NEGATIVE mg/dL
Hgb urine dipstick: NEGATIVE
KETONES UR: NEGATIVE mg/dL
LEUKOCYTES UA: NEGATIVE
Nitrite: NEGATIVE
PH: 5 (ref 5.0–8.0)
Protein, ur: 30 mg/dL — AB
SQUAMOUS EPITHELIAL / LPF: NONE SEEN
Specific Gravity, Urine: 1.013 (ref 1.005–1.030)

## 2017-03-24 LAB — COMPREHENSIVE METABOLIC PANEL
ALT: 34 U/L (ref 17–63)
AST: 41 U/L (ref 15–41)
Albumin: 4.3 g/dL (ref 3.5–5.0)
Alkaline Phosphatase: 89 U/L (ref 38–126)
Anion gap: 8 (ref 5–15)
BUN: 24 mg/dL — AB (ref 6–20)
CHLORIDE: 106 mmol/L (ref 101–111)
CO2: 28 mmol/L (ref 22–32)
CREATININE: 1.44 mg/dL — AB (ref 0.61–1.24)
Calcium: 9.2 mg/dL (ref 8.9–10.3)
GFR calc Af Amer: 53 mL/min — ABNORMAL LOW (ref >60)
GFR calc non Af Amer: 46 mL/min — ABNORMAL LOW (ref >60)
Glucose, Bld: 84 mg/dL (ref 65–99)
POTASSIUM: 4.1 mmol/L (ref 3.5–5.1)
SODIUM: 142 mmol/L (ref 135–145)
Total Bilirubin: 0.7 mg/dL (ref 0.3–1.2)
Total Protein: 7.4 g/dL (ref 6.5–8.1)

## 2017-03-24 LAB — C DIFFICILE QUICK SCREEN W PCR REFLEX
C DIFFICILE (CDIFF) INTERP: NOT DETECTED
C DIFFICILE (CDIFF) TOXIN: NEGATIVE
C Diff antigen: NEGATIVE

## 2017-03-24 LAB — CBC
HEMATOCRIT: 46.3 % (ref 40.0–52.0)
Hemoglobin: 15.6 g/dL (ref 13.0–18.0)
MCH: 30.2 pg (ref 26.0–34.0)
MCHC: 33.7 g/dL (ref 32.0–36.0)
MCV: 89.6 fL (ref 80.0–100.0)
PLATELETS: 269 10*3/uL (ref 150–440)
RBC: 5.17 MIL/uL (ref 4.40–5.90)
RDW: 14.4 % (ref 11.5–14.5)
WBC: 19.1 10*3/uL — ABNORMAL HIGH (ref 3.8–10.6)

## 2017-03-24 LAB — TROPONIN I

## 2017-03-24 LAB — GLUCOSE, CAPILLARY: Glucose-Capillary: 137 mg/dL — ABNORMAL HIGH (ref 65–99)

## 2017-03-24 LAB — LIPASE, BLOOD: LIPASE: 14 U/L (ref 11–51)

## 2017-03-24 MED ORDER — IOPAMIDOL (ISOVUE-300) INJECTION 61%: 30.0000 mL | Freq: Once | INTRAVENOUS | Status: DC | PRN

## 2017-03-24 MED ORDER — PRAVASTATIN SODIUM 40 MG PO TABS: 40.0000 mg | ORAL_TABLET | Freq: Every day | ORAL | Status: DC

## 2017-03-24 MED ORDER — SODIUM CHLORIDE 0.9 % IV BOLUS (SEPSIS)
1000.0000 mL | Freq: Once | INTRAVENOUS | Status: AC
  Administered 2017-03-24: 1000 mL via INTRAVENOUS

## 2017-03-24 MED ORDER — GABAPENTIN 600 MG PO TABS
600.0000 mg | ORAL_TABLET | Freq: Two times a day (BID) | ORAL | Status: DC
  Administered 2017-03-24 – 2017-03-25 (×2): 600 mg via ORAL
  Filled 2017-03-24 (×2): qty 1

## 2017-03-24 MED ORDER — ONDANSETRON HCL 4 MG/2ML IJ SOLN
4.0000 mg | Freq: Four times a day (QID) | INTRAMUSCULAR | Status: DC | PRN
Start: 2017-03-24 — End: 2017-03-25

## 2017-03-24 MED ORDER — ACETAMINOPHEN 650 MG RE SUPP: 650.0000 mg | Freq: Four times a day (QID) | RECTAL | Status: DC | PRN

## 2017-03-24 MED ORDER — INSULIN ASPART 100 UNIT/ML ~~LOC~~ SOLN: 0.0000 [IU] | Freq: Every day | SUBCUTANEOUS | Status: DC

## 2017-03-24 MED ORDER — LORATADINE 10 MG PO TABS
10.0000 mg | ORAL_TABLET | Freq: Every day | ORAL | Status: DC
  Administered 2017-03-24 – 2017-03-25 (×2): 10 mg via ORAL
  Filled 2017-03-24 (×2): qty 1

## 2017-03-24 MED ORDER — ASPIRIN EC 81 MG PO TBEC
81.0000 mg | DELAYED_RELEASE_TABLET | Freq: Every evening | ORAL | Status: DC
  Administered 2017-03-24: 81 mg via ORAL

## 2017-03-24 MED ORDER — ONDANSETRON HCL 4 MG PO TABS
4.0000 mg | ORAL_TABLET | Freq: Four times a day (QID) | ORAL | Status: DC | PRN
Start: 2017-03-24 — End: 2017-03-25

## 2017-03-24 MED ORDER — ONDANSETRON HCL 4 MG/2ML IJ SOLN
4.0000 mg | Freq: Once | INTRAMUSCULAR | Status: AC
  Administered 2017-03-24: 4 mg via INTRAVENOUS
  Filled 2017-03-24: qty 2

## 2017-03-24 MED ORDER — ENOXAPARIN SODIUM 40 MG/0.4ML ~~LOC~~ SOLN: 40.0000 mg | SUBCUTANEOUS | Status: DC

## 2017-03-24 MED ORDER — ACETAMINOPHEN 325 MG PO TABS: 650.0000 mg | ORAL_TABLET | Freq: Four times a day (QID) | ORAL | Status: DC | PRN

## 2017-03-24 MED ORDER — SODIUM CHLORIDE 0.9 % IV SOLN
INTRAVENOUS | Status: DC
  Administered 2017-03-24 – 2017-03-25 (×2): via INTRAVENOUS

## 2017-03-24 MED ORDER — INSULIN ASPART 100 UNIT/ML ~~LOC~~ SOLN
0.0000 [IU] | Freq: Three times a day (TID) | SUBCUTANEOUS | Status: DC
  Administered 2017-03-25: 1 [IU] via SUBCUTANEOUS
  Administered 2017-03-25: 3 [IU] via SUBCUTANEOUS
  Filled 2017-03-24: qty 1
  Filled 2017-03-24: qty 3

## 2017-03-24 MED ORDER — LOPERAMIDE HCL 2 MG PO CAPS
2.0000 mg | ORAL_CAPSULE | Freq: Four times a day (QID) | ORAL | Status: DC | PRN
  Administered 2017-03-25: 2 mg via ORAL
  Filled 2017-03-24: qty 1

## 2017-03-24 MED ORDER — IOPAMIDOL (ISOVUE-300) INJECTION 61%
75.0000 mL | Freq: Once | INTRAVENOUS | Status: AC | PRN
  Administered 2017-03-24: 75 mL via INTRAVENOUS

## 2017-03-24 NOTE — ED Notes (Signed)
Pt completed contrast CT notified.

## 2017-03-24 NOTE — ED Triage Notes (Addendum)
Pt to ED via EMS from home with c/o nausea, vomiting, and diarrhea since this am. Per EMS pt HR was 140s upon arrival, given fluids and 4mg  zofran HR decreased to 110s, cbg 95. Pt A&OX4

## 2017-03-24 NOTE — ED Provider Notes (Signed)
[facility] Emergency Department Provider Note   ____________________________________________   First MD Initiated Contact with Patient 03/24/17 1756     (approximate)  I have reviewed the triage vital signs and the nursing notes.   HISTORY  Chief Complaint Emesis and Diarrhea    HPI Jorge Bradford is a 76 y.o. male here for evaluation of vomiting with severe diarrhea.  Patient is normal setting health until this morning when he began having loose watery stools. He ate a normal lunch. He went out to the ER to have been tilling for a while when he started having abdominal cramps and then began having severe vomiting. Reports crampy discomfort, he is "vomited straight" for 2 hours with watery emesis. No black or bloody stool or vomit.  Right now he reports he feels somewhat better, paramedics gave him nausea medicine and fluids he is beginning to feel better. He has not had a chest pain or trouble breathing. He feels "weak and dehydrated".  He had similar symptoms in March or diagnosis "gastroenteritis". Of note he is also lost about 6 pounds over the last 2 months unintentionally, and he seen his primary care doctor for this just a few days ago.   Past Medical History:  Diagnosis Date  . Anxiety   . Coronary artery disease   . Diabetes mellitus without complication ([place])   . Hypertension   . Reflux   . Thyroid disease     There are no active problems to display for this patient.   Past Surgical History:  Procedure Laterality Date  . CHOLECYSTECTOMY  2000  . CHOLECYSTECTOMY    . COLONOSCOPY WITH PROPOFOL N/A 06/29/2015   Procedure: COLONOSCOPY WITH PROPOFOL;  Surgeon: Lollie Sails, MD;  Location: [facility] ENDOSCOPY;  Service: Endoscopy;  Laterality: N/A;  . CORONARY ANGIOPLASTY    . ESOPHAGOGASTRODUODENOSCOPY (EGD) WITH PROPOFOL N/A 06/29/2015   Procedure: ESOPHAGOGASTRODUODENOSCOPY (EGD) WITH PROPOFOL;  Surgeon: Lollie Sails, MD;   Location: [facility] ENDOSCOPY;  Service: Endoscopy;  Laterality: N/A;    Prior to Admission medications   Medication Sig Start Date End Date Taking? Authorizing Provider  aspirin EC 81 MG tablet Take 81 mg by mouth every evening.    Yes [provider]  cetirizine (ZYRTEC) 10 MG tablet Take 10 mg by mouth daily.   Yes [provider]  enalapril (VASOTEC) 10 MG tablet Take 10 mg by mouth daily.  04/05/15  Yes [provider]  gabapentin (NEURONTIN) 600 MG tablet Take 600 mg by mouth 2 (two) times daily.  04/20/15  Yes [provider]  insulin detemir (LEVEMIR) 100 UNIT/ML injection Inject 23-27 Units into the skin 2 (two) times daily. 27 units qam and 23 units qhs   Yes [provider]  lovastatin (MEVACOR) 40 MG tablet Take 40 mg by mouth at bedtime.  04/05/15  Yes [provider]  metFORMIN (GLUCOPHAGE) 500 MG tablet Take 1,000 mg by mouth 2 (two) times daily. 03/12/17  Yes [provider]  metoprolol succinate (TOPROL-XL) 25 MG 24 hr tablet Take 25 mg by mouth daily.  03/26/15  Yes [provider]  ondansetron (ZOFRAN ODT) 4 MG disintegrating tablet Take 1 tablet (4 mg total) by mouth every 8 (eight) hours as needed for nausea or vomiting. 01/07/17  Yes Harvest Dark, MD    Allergies Patient has no known allergies.  Family History  Problem Relation Age of Onset  . Prostate cancer Brother   . Prostate cancer Brother  Social History Social History  Substance Use Topics  . Smoking status: Former Smoker    Packs/day: 3.00    Years: 30.00    Types: Cigarettes    Quit date: 10/30/1990  . Smokeless tobacco: Never Used  . Alcohol use No    Review of Systems Constitutional: No fever/chills Eyes: No visual changes. ENT: No sore throat.Feels scratchy after vomiting multiple times Cardiovascular: Denies chest pain. Respiratory: Denies shortness of breath. Gastrointestinal: Reports his whole stomach feels "sore" from  throwing up so much  No constipation. Genitourinary: Negative for dysuria. Musculoskeletal: Negative for back pain. Skin: Negative for rash. Neurological: Negative for headaches, focal weakness or numbness.  No recent antibiotic use. Does not have a history of C. difficile.  10-point ROS otherwise negative.  ____________________________________________   PHYSICAL EXAM:  VITAL SIGNS: ED Triage Vitals  Enc Vitals Group     BP 03/24/17 1712 (!) 151/61     Pulse Rate 03/24/17 1712 (!) 118     Resp 03/24/17 1712 16     Temp 03/24/17 1712 98.3 F (36.8 C)     Temp Source 03/24/17 1712 Oral     SpO2 03/24/17 1712 94 %     Weight --      Height --      Head Circumference --      Peak Flow --      Pain Score 03/24/17 1708 0     Pain Loc --      Pain Edu? --      Excl. in [place]? --     Constitutional: Alert and oriented. Fatigued, mildly ill-appearing but in no acute distress. He does appear nauseated Eyes: Conjunctivae are normal. PERRL. EOMI. Head: Atraumatic. Nose: No congestion/rhinnorhea. Mouth/Throat: Mucous membranes are dry.  Oropharynx non-erythematous. Neck: No stridor.   Cardiovascular: Tachycardic rate, regular rhythm. Grossly normal heart sounds.  Good peripheral circulation. Respiratory: Normal respiratory effort.  No retractions. Lungs CTAB. Gastrointestinal: Soft and nontender throughout, he does report it feels a little "uncomfortable from all the vomiting but denies any focal tenderness. No distention.  Musculoskeletal: No lower extremity tenderness nor edema.  No joint effusions. Neurologic:  Normal speech and language. No gross focal neurologic deficits are appreciated.  Skin:  Skin is warm, dry and intact. No rash noted. Psychiatric: Mood and affect are normal. Speech and behavior are normal.  ____________________________________________   LABS (all labs ordered are listed, but only abnormal results are displayed)  Labs Reviewed  COMPREHENSIVE METABOLIC  PANEL - Abnormal; Notable for the following:       Result Value   BUN 24 (*)    Creatinine, Ser 1.44 (*)    GFR calc non Af Amer 46 (*)    GFR calc Af Amer 53 (*)    All other components within normal limits  CBC - Abnormal; Notable for the following:    WBC 19.1 (*)    All other components within normal limits  URINALYSIS, COMPLETE (UACMP) WITH MICROSCOPIC - Abnormal; Notable for the following:    Color, Urine YELLOW (*)    APPearance HAZY (*)    Protein, ur 30 (*)    Bacteria, UA RARE (*)    All other components within normal limits  C DIFFICILE QUICK SCREEN W PCR REFLEX  GASTROINTESTINAL PANEL BY PCR, STOOL (REPLACES STOOL CULTURE)  LIPASE, BLOOD  TROPONIN I  CBG MONITORING, ED   ____________________________________________  EKG  Reviewed and interrupted me at 1720 Heart rate 110 QS 140 QTc 440  Sinus tachycardia, somewhat unusual intraventricular conduction delay, possibly a right bundle branch block, T-wave inversions noted in inferior and lateral distribution  Compared with previous EKG from 2011, T-wave inversions appear new but the patient does not have any cardiac or pulmonary symptoms reported. We'll send troponins ____________________________________________  RADIOLOGY  Ct Abdomen Pelvis W Contrast  Result Date: 03/24/2017 CLINICAL DATA:  Nausea, vomiting and diarrhea since this morning. EXAM: CT ABDOMEN AND PELVIS WITH CONTRAST TECHNIQUE: Multidetector CT imaging of the abdomen and pelvis was performed using the standard protocol following bolus administration of intravenous contrast. CONTRAST:  25m ISOVUE-300 IOPAMIDOL (ISOVUE-300) INJECTION 61% COMPARISON:  CT abdomen dated 06/03/2010. FINDINGS: Lower chest: No acute abnormality. Hepatobiliary: Status post cholecystectomy. No focal liver abnormality is seen. No bile duct dilatation. Pancreas: No acute findings. Again noted are findings compatible with chronic pancreatitis, with associated dystrophic  calcifications in the pancreatic head and pancreatic body and intervening pancreatic duct dilatation/ectasia. Spleen: Normal in size without focal abnormality. Adrenals/Urinary Tract: Adrenal glands are unremarkable. Kidneys are unremarkable without mass, stone or hydronephrosis. No ureteral or bladder calculi are identified. Bladder it is unremarkable, partially decompressed. Stomach/Bowel: Bowel is normal in caliber. No bowel wall thickening or evidence of bowel wall inflammation. Appendix is normal. Stomach appears normal. Vascular/Lymphatic: Aortic atherosclerosis. No enlarged abdominal or pelvic lymph nodes. Reproductive: Prostate gland is moderately enlarged causing slight mass effect on the bladder base. Other: No free fluid or abscess collection seen. No free intraperitoneal air. Musculoskeletal: No acute or suspicious osseous finding. Degenerative changes throughout the thoracolumbar spine, mild to moderate in degree. IMPRESSION: 1. No acute findings within the abdomen or pelvis. No bowel obstruction or evidence of bowel wall inflammation. No evidence of acute solid organ abnormality. 2. Sequela of chronic pancreatitis. 3. Aortic atherosclerosis. 4. Prostate gland is moderately enlarged. Electronically Signed   By: SFranki CabotM.D.   On: 03/24/2017 19:45   Dg Chest Portable 1 View  Result Date: 03/24/2017 CLINICAL DATA:  Leukocytosis EXAM: PORTABLE CHEST 1 VIEW COMPARISON:  05/04/2015 FINDINGS: Cardiac shadow is within normal limits. The lungs are well aerated bilaterally. No focal infiltrate or sizable effusion is noted. No bony abnormality is seen. IMPRESSION: No acute abnormality noted. Electronically Signed   By: MInez CatalinaM.D.   On: 03/24/2017 18:11    ____________________________________________   PROCEDURES  Procedure(s) performed: None  Procedures  Critical Care performed: No  ____________________________________________   INITIAL IMPRESSION / ASSESSMENT AND PLAN / ED  COURSE  Pertinent labs & imaging results that were available during my care of the patient were reviewed by me and considered in my medical decision making (see chart for details).  Patient transfer evaluation of severe nausea vomiting and dehydration. He is notably tachycardic with a heart rate or poorly 140 on EMS arrival. He reports intractable feeling of nausea, vomiting multiple/and numerous times.  Mild discomfort throughout exam, which he identifies as pain after vomiting but no focal tenderness. Denies acute cardiac or pulmonary symptoms.  Clinical Course as of Mar 24 2037  Sat Mar 24, 2017  2037 EGFR (Laqueta Jean): (!) 46 [MQ]  2037 WBC: (!) 19.1 [MQ]  2037 C Diff antigen: NEGATIVE [MQ]    Clinical Course User Index [MQ] QDelman Kitten MD   ----------------------------------------- 8:39 PM on 03/24/2017 -----------------------------------------  Patient continues to feel fatigued but overall somewhat better. He's had loose stools ongoing in the ER. He appears fatigued, but does appear overall improved. He's had 2 L of fluid now when accounting for  EMS administration of fluids, and continues to show evidence of mild ongoing tachycardia. Given his ongoing tachycardia, persistent diarrhea, age discussed with patient and his family and will admit him for further evaluation and ongoing care. He does appear clinically dehydrated, with ongoing symptomatology.  ____________________________________________   FINAL CLINICAL IMPRESSION(S) / ED DIAGNOSES  Final diagnoses:  Nausea vomiting and diarrhea  Intractable diarrhea      NEW MEDICATIONS STARTED DURING THIS VISIT:  New Prescriptions   No medications on file     Note:  This document was prepared using Dragon voice recognition software and may include unintentional dictation errors.     Delman Kitten, MD 03/24/17 2041

## 2017-03-24 NOTE — ED Notes (Signed)
Pt was given 1L of fluids prior to arrival.

## 2017-03-24 NOTE — H&P (Signed)
[facility] NAME: Jorge Bradford    MR#:  [id number]  DATE OF BIRTH:  March 30, 1941  DATE OF ADMISSION:  03/24/2017  PRIMARY CARE PHYSICIAN: Denton Lank, MD   REQUESTING/REFERRING PHYSICIAN: Dr. Delman Kitten  CHIEF COMPLAINT:   Chief Complaint  Patient presents with  . Emesis  . Diarrhea    HISTORY OF PRESENT ILLNESS:  Jorge Bradford  is a 76 y.o. male with a known history of Coronary artery disease, diabetes, hypertension, GERD, anxiety who presents to the hospital due to nausea vomiting and diarrhea that began earlier today. Patient says he's had multiple episodes of diarrhea which has been loose and watery in nature. He attended take some Imodium but could not keep it down as he started having nausea and vomiting. His wife was a bit concerned and therefore brought into the ER further evaluation. In the ER patient received 2 L of IV fluids, some anti-emetics and still is tachycardic and continues to have persistent nausea with some diarrhea. Hospitalist services were contacted further treatment and evaluation. Patient denies any sick contacts, fevers, chills, chest pain, shortness of breath or any other associated symptoms presently.  PAST MEDICAL HISTORY:   Past Medical History:  Diagnosis Date  . Anxiety   . Coronary artery disease   . Diabetes mellitus without complication ([place])   . Hypertension   . Reflux   . Thyroid disease     PAST SURGICAL HISTORY:   Past Surgical History:  Procedure Laterality Date  . CHOLECYSTECTOMY  2000  . CHOLECYSTECTOMY    . COLONOSCOPY WITH PROPOFOL N/A 06/29/2015   Procedure: COLONOSCOPY WITH PROPOFOL;  Surgeon: Lollie Sails, MD;  Location: [facility] END[facility]COPY;  Service: Endoscopy;  Laterality: N/A;  . CORONARY ANGIOPLASTY    . ESOPHAGOGASTRODUODENOSCOPY (EGD) WITH PROPOFOL N/A 06/29/2015   Procedure: ESOPHAGOGASTRODUODENOSCOPY (EGD) WITH PROPOFOL;  Surgeon: Lollie Sails, MD;  Location: [facility]  ENDOSCOPY;  Service: Endoscopy;  Laterality: N/A;    SOCIAL HISTORY:   Social History  Substance Use Topics  . Smoking status: Former Smoker    Packs/day: 3.00    Years: 30.00    Types: Cigarettes    Quit date: 10/30/1990  . Smokeless tobacco: Never Used  . Alcohol use No    FAMILY HISTORY:   Family History  Problem Relation Age of Onset  . Prostate cancer Brother   . Prostate cancer Brother   . Hypertension Mother   . Heart failure Father     DRUG ALLERGIES:  No Known Allergies  REVIEW OF SYSTEMS:   Review of Systems  Constitutional: Negative for fever and weight loss.  HENT: Negative for congestion, nosebleeds and tinnitus.   Eyes: Negative for blurred vision, double vision and redness.  Respiratory: Negative for cough, hemoptysis and shortness of breath.   Cardiovascular: Negative for chest pain, orthopnea, leg swelling and PND.  Gastrointestinal: Positive for diarrhea, nausea and vomiting. Negative for abdominal pain and melena.  Genitourinary: Negative for dysuria, hematuria and urgency.  Musculoskeletal: Negative for falls and joint pain.  Neurological: Negative for dizziness, tingling, sensory change, focal weakness, seizures, weakness and headaches.  Endo/Heme/Allergies: Negative for polydipsia. Does not bruise/bleed easily.  Psychiatric/Behavioral: Negative for depression and memory loss. The patient is not nervous/anxious.     MEDICATIONS AT HOME:   Prior to Admission medications   Medication Sig Start Date End Date Taking? Authorizing Provider  aspirin EC 81 MG tablet Take 81 mg by mouth every evening.  Yes [provider]  cetirizine (ZYRTEC) 10 MG tablet Take 10 mg by mouth daily.   Yes [provider]  enalapril (VASOTEC) 10 MG tablet Take 10 mg by mouth daily.  04/05/15  Yes [provider]  gabapentin (NEURONTIN) 600 MG tablet Take 600 mg by mouth 2 (two) times daily.  04/20/15  Yes [provider]  insulin detemir  (LEVEMIR) 100 UNIT/ML injection Inject 23-27 Units into the skin 2 (two) times daily. 27 units qam and 23 units qhs   Yes [provider]  lovastatin (MEVACOR) 40 MG tablet Take 40 mg by mouth at bedtime.  04/05/15  Yes [provider]  metFORMIN (GLUCOPHAGE) 500 MG tablet Take 1,000 mg by mouth 2 (two) times daily. 03/12/17  Yes [provider]  metoprolol succinate (TOPROL-XL) 25 MG 24 hr tablet Take 25 mg by mouth daily.  03/26/15  Yes [provider]  ondansetron (ZOFRAN ODT) 4 MG disintegrating tablet Take 1 tablet (4 mg total) by mouth every 8 (eight) hours as needed for nausea or vomiting. 01/07/17  Yes Harvest Dark, MD      VITAL SIGNS:  Blood pressure (!) 111/54, pulse 99, temperature 98.3 F (36.8 C), temperature source Oral, resp. rate 13, SpO2 90 %.  PHYSICAL EXAMINATION:  Physical Exam  GENERAL:  76 y.o.-year-old patient lying in bed in no acute distress.  EYES: Pupils equal, round, reactive to light and accommodation. No scleral icterus. Extraocular muscles intact.  HEENT: Head atraumatic, normocephalic. Oropharynx and nasopharynx clear. No oropharyngeal erythema, moist oral mucosa  NECK:  Supple, no jugular venous distention. No thyroid enlargement, no tenderness.  LUNGS: Normal breath sounds bilaterally, no wheezing, rales, rhonchi. No use of accessory muscles of respiration.  CARDIOVASCULAR: S1, S2 RRR. No murmurs, rubs, gallops, clicks.  ABDOMEN: Soft, nontender, nondistended. Bowel sounds present. No organomegaly or mass.  EXTREMITIES: No pedal edema, cyanosis, or clubbing. + 2 pedal & radial pulses b/l.   NEUROLOGIC: Cranial nerves II through XII are intact. No focal Motor or sensory deficits appreciated b/l PSYCHIATRIC: The patient is alert and oriented x 3. SKIN: No obvious rash, lesion, or ulcer.   LABORATORY PANEL:   CBC  Recent Labs Lab 03/24/17 1708  WBC 19.1*  HGB 15.6  HCT 46.3  PLT 269    ------------------------------------------------------------------------------------------------------------------  Chemistries   Recent Labs Lab 03/24/17 1708  NA 142  K 4.1  CL 106  CO2 28  GLUCOSE 84  BUN 24*  CREATININE 1.44*  CALCIUM 9.2  AST 41  ALT 34  ALKPHOS 89  BILITOT 0.7   ------------------------------------------------------------------------------------------------------------------  Cardiac Enzymes  Recent Labs Lab 03/24/17 1708  TROPONINI <0.03   ------------------------------------------------------------------------------------------------------------------  RADIOLOGY:  Ct Abdomen Pelvis W Contrast  Result Date: 03/24/2017 CLINICAL DATA:  Nausea, vomiting and diarrhea since this morning. EXAM: CT ABDOMEN AND PELVIS WITH CONTRAST TECHNIQUE: Multidetector CT imaging of the abdomen and pelvis was performed using the standard protocol following bolus administration of intravenous contrast. CONTRAST:  55mL ISOVUE-300 IOPAMIDOL (ISOVUE-300) INJECTION 61% COMPARISON:  CT abdomen dated 06/03/2010. FINDINGS: Lower chest: No acute abnormality. Hepatobiliary: Status post cholecystectomy. No focal liver abnormality is seen. No bile duct dilatation. Pancreas: No acute findings. Again noted are findings compatible with chronic pancreatitis, with associated dystrophic calcifications in the pancreatic head and pancreatic body and intervening pancreatic duct dilatation/ectasia. Spleen: Normal in size without focal abnormality. Adrenals/Urinary Tract: Adrenal glands are unremarkable. Kidneys are unremarkable without mass, stone or hydronephrosis. No ureteral or bladder calculi are identified.  Bladder it is unremarkable, partially decompressed. Stomach/Bowel: Bowel is normal in caliber. No bowel wall thickening or evidence of bowel wall inflammation. Appendix is normal. Stomach appears normal. Vascular/Lymphatic: Aortic atherosclerosis. No enlarged abdominal or pelvic lymph  nodes. Reproductive: Prostate gland is moderately enlarged causing slight mass effect on the bladder base. Other: No free fluid or abscess collection seen. No free intraperitoneal air. Musculoskeletal: No acute or suspicious osseous finding. Degenerative changes throughout the thoracolumbar spine, mild to moderate in degree. IMPRESSION: 1. No acute findings within the abdomen or pelvis. No bowel obstruction or evidence of bowel wall inflammation. No evidence of acute solid organ abnormality. 2. Sequela of chronic pancreatitis. 3. Aortic atherosclerosis. 4. Prostate gland is moderately enlarged. Electronically Signed   By: Franki Cabot M.D.   On: 03/24/2017 19:45   Dg Chest Portable 1 View  Result Date: 03/24/2017 CLINICAL DATA:  Leukocytosis EXAM: PORTABLE CHEST 1 VIEW COMPARISON:  05/04/2015 FINDINGS: Cardiac shadow is within normal limits. The lungs are well aerated bilaterally. No focal infiltrate or sizable effusion is noted. No bony abnormality is seen. IMPRESSION: No acute abnormality noted. Electronically Signed   By: Inez Catalina M.D.   On: 03/24/2017 18:11     IMPRESSION AND PLAN:   76 year old male with past medical history of diabetes, hypertension, history of coronary artery disease who presented to the hospital due to nausea vomiting and diarrhea.  1. Acute gastroenteritis-this is a cause of patient's nausea vomiting and diarrhea. -CT scan of the abdomen and pelvis showing no evidence of colitis. C. difficile PCR is negative. -Supportive care with IV fluids, antiemetics, Imodium. Clear liquid diet.  2. Diabetes type 2 without complication-no evidence of hypoglycemia. -Hold insulin, metformin, will place on sliding scale insulin.  3. Hyperlipidemia-continue lovastatin.  4. Essential hypertension-patient's currently normotensive secondary to volume loss from his nausea vomiting diarrhea. Hold and tinnitus for now.  5. Neuropathy secondary to diabetes-continue gabapentin.    All  the records are reviewed and case discussed with ED provider. Management plans discussed with the patient, family and they are in agreement.  CODE STATUS: Full  TOTAL TIME TAKING CARE OF THIS PATIENT: 40 minutes.    Henreitta Leber M.D on 03/24/2017 at 9:02 PM  Between 7am to 6pm - Pager - [phone]  After 6pm go to [email or web address] - password [email or web address] [facility]  [place] [facility]  Office  [phone]  CC: Primary care physician; Denton Lank, MD

## 2017-03-25 LAB — BASIC METABOLIC PANEL
ANION GAP: 4 — AB (ref 5–15)
BUN: 20 mg/dL (ref 6–20)
CALCIUM: 8 mg/dL — AB (ref 8.9–10.3)
CO2: 29 mmol/L (ref 22–32)
Chloride: 105 mmol/L (ref 101–111)
Creatinine, Ser: 1.16 mg/dL (ref 0.61–1.24)
GFR calc non Af Amer: 59 mL/min — ABNORMAL LOW (ref >60)
GLUCOSE: 210 mg/dL — AB (ref 65–99)
POTASSIUM: 4.1 mmol/L (ref 3.5–5.1)
Sodium: 138 mmol/L (ref 135–145)

## 2017-03-25 LAB — GLUCOSE, CAPILLARY
GLUCOSE-CAPILLARY: 150 mg/dL — AB (ref 65–99)
Glucose-Capillary: 201 mg/dL — ABNORMAL HIGH (ref 65–99)

## 2017-03-25 LAB — CBC
HEMATOCRIT: 37.6 % — AB (ref 40.0–52.0)
HEMOGLOBIN: 12.8 g/dL — AB (ref 13.0–18.0)
MCH: 30.6 pg (ref 26.0–34.0)
MCHC: 34 g/dL (ref 32.0–36.0)
MCV: 90.2 fL (ref 80.0–100.0)
Platelets: 189 10*3/uL (ref 150–440)
RBC: 4.17 MIL/uL — AB (ref 4.40–5.90)
RDW: 14.1 % (ref 11.5–14.5)
WBC: 11.6 10*3/uL — ABNORMAL HIGH (ref 3.8–10.6)

## 2017-03-25 MED ORDER — LOPERAMIDE HCL 2 MG PO CAPS: 2.0000 mg | ORAL_CAPSULE | ORAL | 0 refills | Status: DC | PRN

## 2017-03-25 MED ORDER — LOPERAMIDE HCL 2 MG PO CAPS
2.0000 mg | ORAL_CAPSULE | ORAL | Status: DC | PRN
Start: 2017-03-25 — End: 2017-03-25

## 2017-03-25 NOTE — Care Management Obs Status (Signed)
[facility] NOTIFICATION   Patient Details  Name: Jorge Bradford MRN: [id number] Date of Birth: May 27, 1941   Medicare Observation Status Notification Given:  No (discharged home in less than 24 hours of admission)    Faelynn Wynder A, RN 03/25/2017, 10:33 AM

## 2017-03-25 NOTE — Progress Notes (Signed)
MD ordered patient to be discharged home.  Discharge instructions were reviewed with the patient and wife and they voiced understanding.  Patient instructed on making his follow-up appointment.  Prescription given to the patient.  IV was removed with catheter intact.  All patients questions were answered.  Patient left via wheelchair escorted by NT.

## 2017-03-25 NOTE — Discharge Summary (Signed)
Jorge Bradford, 76 y.o., DOB 10-Jun-1941, MRN [id number]. Admission date: 03/24/2017 Discharge Date 03/25/2017 Primary MD Denton Lank, MD Admitting Physician Henreitta Leber, MD  Admission Diagnosis  Nausea vomiting and diarrhea [R11.2, R19.7] Intractable diarrhea [R19.7]  Discharge Diagnosis   Active Problems:   Gastroenteritis   Possible underlying chronic pancreatitis   Anxiety   Coronary artery disease  Diabetes type 2  GERD Hypothyroidism       Hospital Course Jorge Bradford  is a 76 y.o. male with a known history of Coronary artery disease, diabetes, hypertension, GERD, anxiety who presents to the hospital due to nausea vomiting and diarrhea that began earlier today. Patient says he's had multiple episodes of diarrhea which has been loose and watery in nature. He was evaluated in the ED and had a CT scan of the abdomen showed possible chronic pancreatitis. Patient's stool studies were negative for C. difficile Other cultures. Patient's diarrhea is mostly resolved. He is concerned that he had a similar episode before. With the CT scan findings with this pancreas I recommend he follow-up with GI for further recommendations probably will need a colonoscopy.             Consults  None  Significant Tests:  See full reports for all details     Ct Abdomen Pelvis W Contrast  Result Date: 03/24/2017 CLINICAL DATA:  Nausea, vomiting and diarrhea since this morning. EXAM: CT ABDOMEN AND PELVIS WITH CONTRAST TECHNIQUE: Multidetector CT imaging of the abdomen and pelvis was performed using the standard protocol following bolus administration of intravenous contrast. CONTRAST:  47mL ISOVUE-300 IOPAMIDOL (ISOVUE-300) INJECTION 61% COMPARISON:  CT abdomen dated 06/03/2010. FINDINGS: Lower chest: No acute abnormality. Hepatobiliary: Status post cholecystectomy. No focal liver abnormality is seen. No bile duct dilatation. Pancreas: No  acute findings. Again noted are findings compatible with chronic pancreatitis, with associated dystrophic calcifications in the pancreatic head and pancreatic body and intervening pancreatic duct dilatation/ectasia. Spleen: Normal in size without focal abnormality. Adrenals/Urinary Tract: Adrenal glands are unremarkable. Kidneys are unremarkable without mass, stone or hydronephrosis. No ureteral or bladder calculi are identified. Bladder it is unremarkable, partially decompressed. Stomach/Bowel: Bowel is normal in caliber. No bowel wall thickening or evidence of bowel wall inflammation. Appendix is normal. Stomach appears normal. Vascular/Lymphatic: Aortic atherosclerosis. No enlarged abdominal or pelvic lymph nodes. Reproductive: Prostate gland is moderately enlarged causing slight mass effect on the bladder base. Other: No free fluid or abscess collection seen. No free intraperitoneal air. Musculoskeletal: No acute or suspicious osseous finding. Degenerative changes throughout the thoracolumbar spine, mild to moderate in degree. IMPRESSION: 1. No acute findings within the abdomen or pelvis. No bowel obstruction or evidence of bowel wall inflammation. No evidence of acute solid organ abnormality. 2. Sequela of chronic pancreatitis. 3. Aortic atherosclerosis. 4. Prostate gland is moderately enlarged. Electronically Signed   By: Franki Cabot M.D.   On: 03/24/2017 19:45   Dg Chest Portable 1 View  Result Date: 03/24/2017 CLINICAL DATA:  Leukocytosis EXAM: PORTABLE CHEST 1 VIEW COMPARISON:  05/04/2015 FINDINGS: Cardiac shadow is within normal limits. The lungs are well aerated bilaterally. No focal infiltrate or sizable effusion is noted. No bony abnormality is seen. IMPRESSION: No acute abnormality noted. Electronically Signed   By: Inez Catalina M.D.   On: 03/24/2017 18:11       Today   Subjective:   Jorge Bradford  doing better diarrhea mostly was  Objective:   Blood pressure Marland Kitchen)  115/48, pulse 62,  temperature 98.5 F (36.9 C), temperature source Oral, resp. rate 18, height 6\' 3"  (1.905 m), weight 179 lb 6.4 oz (81.4 kg), SpO2 95 %.  .  Intake/Output Summary (Last 24 hours) at 03/25/17 1257 Last data filed at 03/25/17 1002  Gross per 24 hour  Intake             1990 ml  Output             1500 ml  Net              490 ml    Exam VITAL SIGNS: Blood pressure (!) 115/48, pulse 62, temperature 98.5 F (36.9 C), temperature source Oral, resp. rate 18, height 6\' 3"  (1.905 m), weight 179 lb 6.4 oz (81.4 kg), SpO2 95 %.  GENERAL:  76 y.o.-year-old patient lying in the bed with no acute distress.  EYES: Pupils equal, round, reactive to light and accommodation. No scleral icterus. Extraocular muscles intact.  HEENT: Head atraumatic, normocephalic. Oropharynx and nasopharynx clear.  NECK:  Supple, no jugular venous distention. No thyroid enlargement, no tenderness.  LUNGS: Normal breath sounds bilaterally, no wheezing, rales,rhonchi or crepitation. No use of accessory muscles of respiration.  CARDIOVASCULAR: S1, S2 normal. No murmurs, rubs, or gallops.  ABDOMEN: Soft, nontender, nondistended. Bowel sounds present. No organomegaly or mass.  EXTREMITIES: No pedal edema, cyanosis, or clubbing.  NEUROLOGIC: Cranial nerves II through XII are intact. Muscle strength 5/5 in all extremities. Sensation intact. Gait not checked.  PSYCHIATRIC: The patient is alert and oriented x 3.  SKIN: No obvious rash, lesion, or ulcer.   Data Review     CBC w Diff: Lab Results  Component Value Date   WBC 11.6 (H) 03/25/2017   HGB 12.8 (L) 03/25/2017   HCT 37.6 (L) 03/25/2017   PLT 189 03/25/2017   CMP: Lab Results  Component Value Date   NA 138 03/25/2017   K 4.1 03/25/2017   CL 105 03/25/2017   CO2 29 03/25/2017   BUN 20 03/25/2017   CREATININE 1.16 03/25/2017   PROT 7.4 03/24/2017   ALBUMIN 4.3 03/24/2017   BILITOT 0.7 03/24/2017   ALKPHOS 89 03/24/2017   AST 41 03/24/2017   ALT 34 03/24/2017   .  Micro Results Recent Results (from the past 240 hour(s))  Gastrointestinal Panel by PCR , Stool     Status: None   Collection Time: 03/24/17  6:53 PM  Result Value Ref Range Status   Campylobacter species NOT DETECTED NOT DETECTED Final   Plesimonas shigelloides NOT DETECTED NOT DETECTED Final   Salmonella species NOT DETECTED NOT DETECTED Final   Yersinia enterocolitica NOT DETECTED NOT DETECTED Final   Vibrio species NOT DETECTED NOT DETECTED Final   Vibrio cholerae NOT DETECTED NOT DETECTED Final   Enteroaggregative E coli (EAEC) NOT DETECTED NOT DETECTED Final   Enteropathogenic E coli (EPEC) NOT DETECTED NOT DETECTED Final   Enterotoxigenic E coli (ETEC) NOT DETECTED NOT DETECTED Final   Shiga like toxin producing E coli (STEC) NOT DETECTED NOT DETECTED Final   Shigella/Enteroinvasive E coli (EIEC) NOT DETECTED NOT DETECTED Final   Cryptosporidium NOT DETECTED NOT DETECTED Final   Cyclospora cayetanensis NOT DETECTED NOT DETECTED Final   Entamoeba histolytica NOT DETECTED NOT DETECTED Final   Giardia lamblia NOT DETECTED NOT DETECTED Final   Adenovirus F40/41 NOT DETECTED NOT DETECTED Final   Astrovirus NOT DETECTED NOT DETECTED Final   Norovirus GI/GII NOT DETECTED NOT DETECTED Final  Rotavirus A NOT DETECTED NOT DETECTED Final   Sapovirus (I, II, IV, and V) NOT DETECTED NOT DETECTED Final  C difficile quick scan w PCR reflex     Status: None   Collection Time: 03/24/17  6:53 PM  Result Value Ref Range Status   C Diff antigen NEGATIVE NEGATIVE Final   C Diff toxin NEGATIVE NEGATIVE Final   C Diff interpretation No C. difficile detected.  Final        Code Status Orders        Start     Ordered   03/24/17 2158  Full code  Continuous     03/24/17 2157    Code Status History    Date Active Date Inactive Code Status Order ID Comments User Context   This patient has a current code status but no historical code status.          Follow-up Information     Denton Lank, MD. Schedule an appointment as soon as possible for a visit in 1 week(s).   Specialty:  Family Medicine Contact information: [address] [address] [phone]        Lucilla Lame, MD. Schedule an appointment as soon as possible for a visit in 1 week(s).   Specialty:  Gastroenterology Why:  recurrent diarrhea need colonoscopy Contact information: [place] [address] [phone]           Discharge Medications   Allergies as of 03/25/2017   No Known Allergies     Medication List    TAKE these medications   aspirin EC 81 MG tablet Take 81 mg by mouth every evening.   cetirizine 10 MG tablet Commonly known as:  ZYRTEC Take 10 mg by mouth daily.   enalapril 10 MG tablet Commonly known as:  VASOTEC Take 10 mg by mouth daily.   gabapentin 600 MG tablet Commonly known as:  NEURONTIN Take 600 mg by mouth 2 (two) times daily.   insulin detemir 100 UNIT/ML injection Commonly known as:  LEVEMIR Inject 23-27 Units into the skin 2 (two) times daily. 27 units qam and 23 units qhs   loperamide 2 MG capsule Commonly known as:  IMODIUM Take 1 capsule (2 mg total) by mouth as needed for diarrhea or loose stools. Notes to patient:  Last dose given 03/25/17 at 12:15pm   lovastatin 40 MG tablet Commonly known as:  MEVACOR Take 40 mg by mouth at bedtime.   metFORMIN 500 MG tablet Commonly known as:  GLUCOPHAGE Take 1,000 mg by mouth 2 (two) times daily.   metoprolol succinate 25 MG 24 hr tablet Commonly known as:  TOPROL-XL Take 25 mg by mouth daily.   ondansetron 4 MG disintegrating tablet Commonly known as:  ZOFRAN ODT Take 1 tablet (4 mg total) by mouth every 8 (eight) hours as needed for nausea or vomiting.          Total Time in preparing paper work, data evaluation and todays exam - 35 minutes  Dustin Flock M.D on 03/25/2017 at 12:57 PM  [facility]   Office  [phone]

## 2017-03-25 NOTE — Progress Notes (Signed)
Called Dr. Posey Pronto to let him know that the patient was concerned about his discharge and felt that something was wrong with his stomach.  Patient also wanted something for his bowels.  Dr. Posey Pronto put in order for immodium and also put in for pt to see GI as outpatient.

## 2017-03-25 NOTE — Progress Notes (Signed)
Patient refuses to have the bed alarm on, even after being educated on it.

## 2017-05-17 ENCOUNTER — Other Ambulatory Visit: Payer: Self-pay | Admitting: Gastroenterology

## 2017-05-17 DIAGNOSIS — K861 Other chronic pancreatitis: Secondary | ICD-10-CM

## 2017-05-24 ENCOUNTER — Other Ambulatory Visit: Payer: Self-pay | Admitting: Gastroenterology

## 2017-05-24 ENCOUNTER — Ambulatory Visit
Admission: RE | Admit: 2017-05-24 | Discharge: 2017-05-24 | Disposition: A | Discharge status: Home / Self Care | Payer: Medicare Other | Source: Ambulatory Visit | Attending: Gastroenterology | Admitting: Gastroenterology

## 2017-05-24 DIAGNOSIS — K861 Other chronic pancreatitis: Secondary | ICD-10-CM

## 2017-05-24 LAB — POCT I-STAT CREATININE: CREATININE: 1.3 mg/dL — AB (ref 0.61–1.24)

## 2017-05-24 MED ORDER — GADOBENATE DIMEGLUMINE 529 MG/ML IV SOLN
16.0000 mL | Freq: Once | INTRAVENOUS | Status: AC | PRN
  Administered 2017-05-24: 16 mL via INTRAVENOUS

## 2018-10-17 ENCOUNTER — Encounter: Payer: Self-pay | Admitting: Emergency Medicine

## 2018-10-17 ENCOUNTER — Emergency Department
Admission: EM | Admit: 2018-10-17 | Discharge: 2018-10-17 | Disposition: A | Discharge status: Other Acute Inpatient Hospital | Payer: Medicare Other | Attending: Emergency Medicine | Admitting: Emergency Medicine

## 2018-10-17 ENCOUNTER — Emergency Department: Payer: Medicare Other

## 2018-10-17 ENCOUNTER — Other Ambulatory Visit: Payer: Self-pay

## 2018-10-17 DIAGNOSIS — Z79899 Other long term (current) drug therapy: Secondary | ICD-10-CM | POA: Diagnosis not present

## 2018-10-17 DIAGNOSIS — Y939 Activity, unspecified: Secondary | ICD-10-CM | POA: Insufficient documentation

## 2018-10-17 DIAGNOSIS — S2241XA Multiple fractures of ribs, right side, initial encounter for closed fracture: Secondary | ICD-10-CM | POA: Diagnosis not present

## 2018-10-17 DIAGNOSIS — W1789XA Other fall from one level to another, initial encounter: Secondary | ICD-10-CM | POA: Insufficient documentation

## 2018-10-17 DIAGNOSIS — Z7982 Long term (current) use of aspirin: Secondary | ICD-10-CM | POA: Insufficient documentation

## 2018-10-17 DIAGNOSIS — R778 Other specified abnormalities of plasma proteins: Secondary | ICD-10-CM

## 2018-10-17 DIAGNOSIS — M25511 Pain in right shoulder: Secondary | ICD-10-CM | POA: Insufficient documentation

## 2018-10-17 DIAGNOSIS — Y929 Unspecified place or not applicable: Secondary | ICD-10-CM | POA: Insufficient documentation

## 2018-10-17 DIAGNOSIS — I2510 Atherosclerotic heart disease of native coronary artery without angina pectoris: Secondary | ICD-10-CM | POA: Diagnosis not present

## 2018-10-17 DIAGNOSIS — R7989 Other specified abnormal findings of blood chemistry: Secondary | ICD-10-CM | POA: Diagnosis not present

## 2018-10-17 DIAGNOSIS — Z794 Long term (current) use of insulin: Secondary | ICD-10-CM | POA: Diagnosis not present

## 2018-10-17 DIAGNOSIS — Z87891 Personal history of nicotine dependence: Secondary | ICD-10-CM | POA: Diagnosis not present

## 2018-10-17 DIAGNOSIS — E119 Type 2 diabetes mellitus without complications: Secondary | ICD-10-CM | POA: Insufficient documentation

## 2018-10-17 DIAGNOSIS — Y999 Unspecified external cause status: Secondary | ICD-10-CM | POA: Diagnosis not present

## 2018-10-17 DIAGNOSIS — I10 Essential (primary) hypertension: Secondary | ICD-10-CM | POA: Insufficient documentation

## 2018-10-17 DIAGNOSIS — W19XXXA Unspecified fall, initial encounter: Secondary | ICD-10-CM

## 2018-10-17 DIAGNOSIS — R079 Chest pain, unspecified: Secondary | ICD-10-CM | POA: Diagnosis present

## 2018-10-17 LAB — CBC WITH DIFFERENTIAL/PLATELET
Abs Immature Granulocytes: 0.09 10*3/uL — ABNORMAL HIGH (ref 0.00–0.07)
Basophils Absolute: 0 10*3/uL (ref 0.0–0.1)
Basophils Relative: 0 %
Eosinophils Absolute: 0 10*3/uL (ref 0.0–0.5)
Eosinophils Relative: 0 %
HCT: 43.1 % (ref 39.0–52.0)
Hemoglobin: 13.7 g/dL (ref 13.0–17.0)
Immature Granulocytes: 1 %
Lymphocytes Relative: 5 %
Lymphs Abs: 1 10*3/uL (ref 0.7–4.0)
MCH: 29.5 pg (ref 26.0–34.0)
MCHC: 31.8 g/dL (ref 30.0–36.0)
MCV: 92.9 fL (ref 80.0–100.0)
MONOS PCT: 6 %
Monocytes Absolute: 1 10*3/uL (ref 0.1–1.0)
Neutro Abs: 16.2 10*3/uL — ABNORMAL HIGH (ref 1.7–7.7)
Neutrophils Relative %: 88 %
Platelets: 239 10*3/uL (ref 150–400)
RBC: 4.64 MIL/uL (ref 4.22–5.81)
RDW: 13.3 % (ref 11.5–15.5)
WBC: 18.3 10*3/uL — ABNORMAL HIGH (ref 4.0–10.5)
nRBC: 0 % (ref 0.0–0.2)

## 2018-10-17 LAB — BASIC METABOLIC PANEL
Anion gap: 10 (ref 5–15)
BUN: 19 mg/dL (ref 8–23)
CO2: 24 mmol/L (ref 22–32)
Calcium: 9.1 mg/dL (ref 8.9–10.3)
Chloride: 102 mmol/L (ref 98–111)
Creatinine, Ser: 1.06 mg/dL (ref 0.61–1.24)
GFR calc Af Amer: >60 mL/min (ref >60)
GFR calc non Af Amer: >60 mL/min (ref >60)
GLUCOSE: 251 mg/dL — AB (ref 70–99)
Potassium: 4 mmol/L (ref 3.5–5.1)
Sodium: 136 mmol/L (ref 135–145)

## 2018-10-17 LAB — TROPONIN I
TROPONIN I: 34.9 ng/mL — AB (ref <0.03)
Troponin I: >65 ng/mL (ref <0.03)

## 2018-10-17 LAB — HEPARIN LEVEL (UNFRACTIONATED): Heparin Unfractionated: <0.1 IU/mL — ABNORMAL LOW (ref 0.30–0.70)

## 2018-10-17 LAB — PROTIME-INR
INR: 1.13
Prothrombin Time: 14.4 seconds (ref 11.4–15.2)

## 2018-10-17 MED ORDER — HEPARIN BOLUS VIA INFUSION
4000.0000 [IU] | Freq: Once | INTRAVENOUS | Status: DC
  Filled 2018-10-17: qty 4000

## 2018-10-17 MED ORDER — MORPHINE SULFATE (PF) 4 MG/ML IV SOLN
4.0000 mg | Freq: Once | INTRAVENOUS | Status: AC
  Administered 2018-10-17: 4 mg via INTRAVENOUS
  Filled 2018-10-17: qty 1

## 2018-10-17 MED ORDER — HEPARIN (PORCINE) 25000 UT/250ML-% IV SOLN: 1000.0000 [IU]/h | INTRAVENOUS | Status: DC

## 2018-10-17 MED ORDER — ASPIRIN 81 MG PO CHEW
324.0000 mg | CHEWABLE_TABLET | Freq: Once | ORAL | Status: AC
  Administered 2018-10-17: 324 mg via ORAL
  Filled 2018-10-17: qty 4

## 2018-10-17 MED ORDER — OXYCODONE-ACETAMINOPHEN 5-325 MG PO TABS
2.0000 | ORAL_TABLET | Freq: Once | ORAL | Status: AC
  Administered 2018-10-17: 2 via ORAL
  Filled 2018-10-17: qty 2

## 2018-10-17 MED ORDER — IOPAMIDOL (ISOVUE-300) INJECTION 61%
100.0000 mL | Freq: Once | INTRAVENOUS | Status: AC | PRN
  Administered 2018-10-17: 100 mL via INTRAVENOUS

## 2018-10-17 NOTE — ED Notes (Signed)
Date and time results received: 10/17/18 8:49 PM  (use smartphrase ".now" to insert current time)  Test: Trop Critical Value: 34.9  Name of Provider Notified: Dr. Archie Balboa  Orders Received? Or Actions Taken?: Orders Received - See Orders for details

## 2018-10-17 NOTE — ED Provider Notes (Signed)
[facility] Emergency Department Provider Note  ____________________________________________   I have reviewed the triage vital signs and the nursing notes.   HISTORY  Chief Complaint Right sided chest pain  History limited by: Not Limited   HPI Jorge Bradford is a 77 y.o. male who presents to the emergency department today because of concern for severe right chest pain after a fall. The patient states that he was about 6 feet up a deer stand when he thinks his foot slipped. He landed on his right side. He denies hitting his head or any loss of consciousness. The patient states that he was able to walk out 250 yards to his truck with frequent breaks. He does have pain with deep breaths. He denies any preceding chest pain, shortness of breath, headache. He does also have complaints of right shoulder pain.   Per medical record review patient has a history of CAD  Past Medical History:  Diagnosis Date  . Anxiety   . Coronary artery disease   . Diabetes mellitus without complication ([place])   . Hypertension   . Reflux   . Thyroid disease     Patient Active Problem List   Diagnosis Date Noted  . Gastroenteritis 03/24/2017    Past Surgical History:  Procedure Laterality Date  . CHOLECYSTECTOMY  2000  . CHOLECYSTECTOMY    . COLONOSCOPY WITH PROPOFOL N/A 06/29/2015   Procedure: COLONOSCOPY WITH PROPOFOL;  Surgeon: Lollie Sails, MD;  Location: [facility] ENDOSCOPY;  Service: Endoscopy;  Laterality: N/A;  . CORONARY ANGIOPLASTY    . ESOPHAGOGASTRODUODENOSCOPY (EGD) WITH PROPOFOL N/A 06/29/2015   Procedure: ESOPHAGOGASTRODUODENOSCOPY (EGD) WITH PROPOFOL;  Surgeon: Lollie Sails, MD;  Location: [facility] ENDOSCOPY;  Service: Endoscopy;  Laterality: N/A;    Prior to Admission medications   Medication Sig Start Date End Date Taking? Authorizing Provider  aspirin EC 81 MG tablet Take 81 mg by mouth every evening.    Yes [provider]  atorvastatin  (LIPITOR) 80 MG tablet Take 80 mg by mouth at bedtime. 07/15/15  Yes [provider]  cetirizine (ZYRTEC) 10 MG tablet Take 10 mg by mouth daily.   Yes [provider]  gabapentin (NEURONTIN) 600 MG tablet Take 600 mg by mouth 2 (two) times daily.  04/20/15  Yes [provider]  insulin detemir (LEVEMIR) 100 UNIT/ML injection Inject 23-27 Units into the skin 2 (two) times daily. 27 units qam and 23 units qhs   Yes [provider]  lovastatin (MEVACOR) 40 MG tablet Take 40 mg by mouth at bedtime.  04/05/15  Yes [provider]  metFORMIN (GLUCOPHAGE) 500 MG tablet Take 1,000 mg by mouth 2 (two) times daily. 03/12/17  Yes [provider]  metoprolol succinate (TOPROL-XL) 25 MG 24 hr tablet Take 25 mg by mouth daily.  03/26/15  Yes [provider]  enalapril (VASOTEC) 10 MG tablet Take 10 mg by mouth daily.  04/05/15   [provider]    Allergies Patient has no known allergies.  Family History  Problem Relation Age of Onset  . Prostate cancer Brother   . Prostate cancer Brother   . Hypertension Mother   . Heart failure Father     Social History Social History   Tobacco Use  . Smoking status: Former Smoker    Packs/day: 3.00    Years: 30.00    Pack years: 90.00    Types: Cigarettes    Last attempt to quit: 10/30/1990    Years  since quitting: 27.9  . Smokeless tobacco: Never Used  Substance Use Topics  . Alcohol use: No    Alcohol/week: 0.0 standard drinks  . Drug use: No    Review of Systems Constitutional: No fever/chills Eyes: No visual changes. ENT: No sore throat. Cardiovascular: Positive for right sided chest pain. Respiratory: Positive for shortness of breath. Gastrointestinal: No abdominal pain.  No nausea, no vomiting.  No diarrhea.   Genitourinary: Negative for dysuria. Musculoskeletal: Positive for right shoulder pain.  Skin: Negative for rash. Neurological: Negative for headaches, focal weakness or  numbness.  ____________________________________________   PHYSICAL EXAM:  VITAL SIGNS: ED Triage Vitals  Enc Vitals Group     BP 10/17/18 1750 (!) 161/66     Pulse Rate 10/17/18 1750 92     Resp 10/17/18 1750 18     Temp 10/17/18 1750 (!) 97.5 F (36.4 C)     Temp Source 10/17/18 1750 Oral     SpO2 10/17/18 1750 100 %     Weight 10/17/18 1752 180 lb (81.6 kg)     Height 10/17/18 1752 6\' 3"  (1.905 m)     Head Circumference --      Peak Flow --      Pain Score 10/17/18 1752 9   Constitutional: Alert and oriented.  Appears uncomfortable.  Eyes: Conjunctivae are normal.  ENT      Head: Normocephalic and atraumatic.      Nose: No congestion/rhinnorhea.      Mouth/Throat: Mucous membranes are moist.      Neck: No stridor. No midline tenderness.  Hematological/Lymphatic/Immunilogical: No cervical lymphadenopathy. Cardiovascular: Normal rate, regular rhythm.  No murmurs, rubs, or gallops. Respiratory: Slightly increased respiratory effort. Full air sounds.  Gastrointestinal: Soft and non tender. No rebound. No guarding.  Genitourinary: Deferred Musculoskeletal: Tender to palpation over the right shoulder, no obvious deformity. Tender to palpation over the right chest without crepitus. Pelvis stable.  Neurologic:  Normal speech and language. Moving all extremities. Sensation intact.  Skin:  Skin is warm, dry and intact.  Psychiatric: Mood and affect are normal. Speech and behavior are normal. Patient exhibits appropriate insight and judgment.  ____________________________________________    LABS (pertinent positives/negatives)  BMP wnl except glu 251 CBC wbc 18.3, hgb 13.7, plt 239 Trop 34.90 -> 65.00 ____________________________________________   EKG  I, Nance Pear, attending physician, personally viewed and interpreted this EKG  EKG Time: 1900 Rate: 83 Rhythm: normal sinus rhythm Axis: right axis deviation Intervals: qtc 465 QRS: RBBB ST changes: no st  elevation Impression: abnormal ekg  I, Nance Pear, attending physician, personally viewed and interpreted this EKG  EKG Time: 2049 Rate: 90 Rhythm: sinus rhythm Axis: right axis deviation Intervals: qtc 465 QRS: RBBB ST changes: no st elevation Impression: abnormal ekg   ____________________________________________    RADIOLOGY  Right hip No acute injury  CXR Multiple rib fractures.   CT cervical spine/head No acute intracranial bleed/no fracture  CT chest/abd/pel Right 1-6th rib fractures. No ptx. No other acute traumatic findings.   ____________________________________________   PROCEDURES  Procedures  CRITICAL CARE Performed by: Nance Pear   Total critical care time: 45 minutes  Critical care time was exclusive of separately billable procedures and treating other patients.  Critical care was necessary to treat or prevent imminent or life-threatening deterioration.  Critical care was time spent personally by me on the following activities: development of treatment plan with patient and/or surrogate as well as nursing, discussions with consultants, evaluation of patient's response  to treatment, examination of patient, obtaining history from patient or surrogate, ordering and performing treatments and interventions, ordering and review of laboratory studies, ordering and review of radiographic studies, pulse oximetry and re-evaluation of patient's condition.  ____________________________________________   INITIAL IMPRESSION / ASSESSMENT AND PLAN / ED COURSE  Pertinent labs & imaging results that were available during my care of the patient were reviewed by me and considered in my medical decision making (see chart for details).   Patient presented to the emergency department today because of concern for right sided pain after a fall. Exam with tenderness over the right chest and right shoulder. CXR sent from triage showed multiple rib fractures.  Patient did have head/cervical spine/chest/abd/pel ct scans performed. In addition trop was found to be significantly elevated. ECG with rbbb no stemi. Antiplatelet therapy was held until negative imaging for any internal bleeding. Patient will be transferred to [facility] for higher level of care.  Heparin was initially ordered however Dr. Hubbard Hartshorn with [facility] asked that heparin be held given concern that elevated troponin would be due to cardiac contusion rather than ACS.    ____________________________________________   FINAL CLINICAL IMPRESSION(S) / ED DIAGNOSES  Final diagnoses:  Fall, initial encounter  Closed fracture of multiple ribs of right side, initial encounter  Elevated troponin  Right shoulder pain, unspecified chronicity     Note: This dictation was prepared with Dragon dictation. Any transcriptional errors that result from this process are unintentional     Nance Pear, MD 10/17/18 [id number]

## 2018-10-17 NOTE — ED Notes (Signed)
CT notified that patient would be a trauma scan at this time.

## 2018-10-17 NOTE — ED Notes (Signed)
EMTALA and Medical Necessity documentation reviewed at this time and noted to be complete per policy. 

## 2018-10-17 NOTE — Progress Notes (Signed)
ANTICOAGULATION CONSULT NOTE - Initial Consult  Pharmacy Consult for Heparin  Indication: chest pain/ACS  No Known Allergies  Patient Measurements: Height: 6\' 3"  (190.5 cm) Weight: 180 lb (81.6 kg) IBW/kg (Calculated) : 84.5 Heparin Dosing Weight: 81.6 kg   Vital Signs: Temp: 97.5 F (36.4 C) (12/19 1750) Temp Source: Oral (12/19 1750) BP: 142/67 (12/19 2130) Pulse Rate: 93 (12/19 2130)  Labs: Recent Labs    10/17/18 1906  HGB 13.7  HCT 43.1  PLT 239  CREATININE 1.06  TROPONINI 34.90*    Estimated Creatinine Clearance: 67.4 mL/min (by C-G formula based on SCr of 1.06 mg/dL).   Medical History: Past Medical History:  Diagnosis Date  . Anxiety   . Coronary artery disease   . Diabetes mellitus without complication ([place])   . Hypertension   . Reflux   . Thyroid disease     Medications:  (Not in a hospital admission)   Assessment: Pharmacy consulted to dose heparin in this 77 year old male.  CrCl = 67.4 ml/min No prior anticoag noted.   Goal of Therapy:  Heparin level 0.3-0.7 units/ml Monitor platelets by anticoagulation protocol: Yes   Plan:  Will order Heparin 4000 units IV X 1 and start heparin gtt 1000 units/hr.  Will order HL 8 hrs after start of drip.  Will check CBC and HL daily .   Ayodeji Keimig D 10/17/2018,10:35 PM

## 2018-10-17 NOTE — ED Triage Notes (Signed)
Pt was in a deer stand and mis stepped causing him to fall 6 ft. Pt states he fell on his left side. Pt denies hitting head or a loc. Pt states he was able to walk the 200 ft to his truck but had to stop every 25 ft to rest due to the pain. Pt denies neck pain. Pt does report headache, shoulder pain, hip pain and rib cage pain.

## 2018-10-17 NOTE — ED Notes (Addendum)
Reviewed pt's cc and results; troponin added, will take pt to next available exam room

## 2018-10-21 MED ORDER — INSULIN GLARGINE 100 UNIT/ML ~~LOC~~ SOLN
23.00 | SUBCUTANEOUS | Status: DC
Start: 2018-10-22 — End: 2018-10-21

## 2018-10-21 MED ORDER — NITROGLYCERIN 0.4 MG SL SUBL
0.40 | SUBLINGUAL_TABLET | SUBLINGUAL | Status: DC
Start: ? — End: 2018-10-21

## 2018-10-21 MED ORDER — ENOXAPARIN SODIUM 40 MG/0.4ML ~~LOC~~ SOLN
40.00 | SUBCUTANEOUS | Status: DC
Start: 2018-10-23 — End: 2018-10-21

## 2018-10-21 MED ORDER — LACTATED RINGERS IV SOLN
100.00 | INTRAVENOUS | Status: DC
Start: ? — End: 2018-10-21

## 2018-10-21 MED ORDER — ATORVASTATIN CALCIUM 80 MG PO TABS
80.00 | ORAL_TABLET | ORAL | Status: DC
Start: 2018-10-23 — End: 2018-10-21

## 2018-10-21 MED ORDER — INSULIN LISPRO 100 UNIT/ML ~~LOC~~ SOLN
.00 | SUBCUTANEOUS | Status: DC
Start: 2018-10-22 — End: 2018-10-21

## 2018-10-21 MED ORDER — TICAGRELOR 90 MG PO TABS
90.00 | ORAL_TABLET | ORAL | Status: DC
Start: 2018-10-21 — End: 2018-10-21

## 2018-10-21 MED ORDER — ACETAMINOPHEN 500 MG PO TABS
1000.00 | ORAL_TABLET | ORAL | Status: DC
Start: 2018-10-23 — End: 2018-10-21

## 2018-10-21 MED ORDER — OXYCODONE HCL 5 MG PO TABS
5.00 | ORAL_TABLET | ORAL | Status: DC
Start: ? — End: 2018-10-21

## 2018-10-21 MED ORDER — DEXTROSE 10 % IV SOLN
12.50 | INTRAVENOUS | Status: DC
Start: ? — End: 2018-10-21

## 2018-10-21 MED ORDER — LIDOCAINE 5 % EX PTCH
2.00 | MEDICATED_PATCH | CUTANEOUS | Status: DC
Start: 2018-10-23 — End: 2018-10-21

## 2018-10-21 MED ORDER — NALOXONE HCL 0.4 MG/ML IJ SOLN
.40 | INTRAMUSCULAR | Status: DC
Start: ? — End: 2018-10-21

## 2018-10-21 MED ORDER — METOPROLOL SUCCINATE ER 50 MG PO TB24
50.00 | ORAL_TABLET | ORAL | Status: DC
Start: 2018-10-23 — End: 2018-10-21

## 2018-10-21 MED ORDER — MELATONIN 3 MG PO TABS
6.00 | ORAL_TABLET | ORAL | Status: DC
Start: 2018-10-22 — End: 2018-10-21

## 2018-10-21 MED ORDER — POLYETHYLENE GLYCOL 3350 17 G PO PACK
17.00 | PACK | ORAL | Status: DC
Start: 2018-10-23 — End: 2018-10-21

## 2018-10-21 MED ORDER — ASPIRIN 81 MG PO CHEW
81.00 | CHEWABLE_TABLET | ORAL | Status: DC
Start: 2018-10-23 — End: 2018-10-21

## 2018-10-21 MED ORDER — SPIRONOLACTONE 25 MG PO TABS
25.00 | ORAL_TABLET | ORAL | Status: DC
Start: 2018-10-23 — End: 2018-10-21

## 2018-10-21 MED ORDER — PANTOPRAZOLE SODIUM 40 MG PO TBEC
40.00 | DELAYED_RELEASE_TABLET | ORAL | Status: DC
Start: 2018-10-23 — End: 2018-10-21

## 2018-10-21 MED ORDER — DOCUSATE SODIUM 100 MG PO CAPS
100.00 | ORAL_CAPSULE | ORAL | Status: DC
Start: 2018-10-22 — End: 2018-10-21

## 2018-10-21 MED ORDER — GABAPENTIN 300 MG PO CAPS
600.00 | ORAL_CAPSULE | ORAL | Status: DC
Start: 2018-10-22 — End: 2018-10-21

## 2018-10-21 MED ORDER — VALSARTAN 40 MG PO TABS
40.00 | ORAL_TABLET | ORAL | Status: DC
Start: 2018-10-22 — End: 2018-10-21

## 2018-10-22 ENCOUNTER — Encounter
Admission: RE | Admit: 2018-10-22 | Discharge: 2018-10-22 | Disposition: A | Discharge status: Home / Self Care | Payer: Medicare Other | Source: Ambulatory Visit | Attending: Internal Medicine | Admitting: Internal Medicine

## 2018-10-23 MED ORDER — CLOPIDOGREL BISULFATE 75 MG PO TABS
75.00 | ORAL_TABLET | ORAL | Status: DC
Start: ? — End: 2018-10-23

## 2018-10-31 ENCOUNTER — Ambulatory Visit
Admission: EM | Admit: 2018-10-31 | Discharge: 2018-10-31 | Disposition: A | Discharge status: Home / Self Care | Payer: Medicare Other | Attending: Family Medicine | Admitting: Family Medicine

## 2018-10-31 ENCOUNTER — Ambulatory Visit (INDEPENDENT_AMBULATORY_CARE_PROVIDER_SITE_OTHER): Payer: Medicare Other

## 2018-10-31 DIAGNOSIS — S2241XD Multiple fractures of ribs, right side, subsequent encounter for fracture with routine healing: Secondary | ICD-10-CM | POA: Diagnosis not present

## 2018-10-31 DIAGNOSIS — R05 Cough: Secondary | ICD-10-CM | POA: Diagnosis not present

## 2018-10-31 DIAGNOSIS — R0602 Shortness of breath: Secondary | ICD-10-CM | POA: Diagnosis not present

## 2018-10-31 DIAGNOSIS — Z87891 Personal history of nicotine dependence: Secondary | ICD-10-CM

## 2018-10-31 NOTE — Discharge Instructions (Signed)
No pneumonia.  Pain medication as prescribed.  Take care  Dr. Lacinda Axon

## 2018-10-31 NOTE — ED Triage Notes (Signed)
Pt reports seen at OSH about 1 week ago after MI and fell from tree stand. Pt reports 6 broken ribs on right side and since then has had worsened cough, congestion and intermittent SOB

## 2018-10-31 NOTE — ED Provider Notes (Signed)
[facility]    CSN: [id number] Arrival date & time: 10/31/18  1256  History   Chief Complaint Chief Complaint  Patient presents with  . Cough   HPI  78 year old male with recent hospital admission for rib fractures and NSTEMI presents with complaints of cough, congestion, shortness of breath, chest/rib pain.  Patient seems to be doing okay.  He and his wife are concerned given the fact that he think of sputum.  He has some associated pain as well as intermittent shortness of breath.  He appears well currently.  Wife is concerned about pneumonia given his multiple rib fractures on the right side.  No reports of fever.  He is taking Tylenol for pain.  He is not currently taking his oxycodone.  Pain is moderate.  Sharp.  No other reported symptoms.  No other complaints concerns at this time.  PMH, Surgical Hx, Family Hx, Social History reviewed and updated as below.  Past Medical History:  Diagnosis Date  . Anxiety   . Coronary artery disease   . Diabetes mellitus without complication ([place])   . Hypertension   . Reflux   . Thyroid disease     Patient Active Problem List   Diagnosis Date Noted  . Gastroenteritis 03/24/2017    Past Surgical History:  Procedure Laterality Date  . CHOLECYSTECTOMY  2000  . CHOLECYSTECTOMY    . COLONOSCOPY WITH PROPOFOL N/A 06/29/2015   Procedure: COLONOSCOPY WITH PROPOFOL;  Surgeon: Lollie Sails, MD;  Location: [facility] ENDOSCOPY;  Service: Endoscopy;  Laterality: N/A;  . CORONARY ANGIOPLASTY    . ESOPHAGOGASTRODUODENOSCOPY (EGD) WITH PROPOFOL N/A 06/29/2015   Procedure: ESOPHAGOGASTRODUODENOSCOPY (EGD) WITH PROPOFOL;  Surgeon: Lollie Sails, MD;  Location: [facility] ENDOSCOPY;  Service: Endoscopy;  Laterality: N/A;    Home Medications    Prior to Admission medications   Medication Sig Start Date End Date Taking? Authorizing Provider  clopidogrel (PLAVIX) 75 MG tablet Take 75 mg by mouth daily.   Yes [provider]    aspirin EC 81 MG tablet Take 81 mg by mouth every evening.     [provider]  atorvastatin (LIPITOR) 80 MG tablet Take 80 mg by mouth at bedtime. 07/15/15   [provider]  cetirizine (ZYRTEC) 10 MG tablet Take 10 mg by mouth daily.    [provider]  enalapril (VASOTEC) 10 MG tablet Take 10 mg by mouth daily.  04/05/15   [provider]  gabapentin (NEURONTIN) 600 MG tablet Take 600 mg by mouth 2 (two) times daily.  04/20/15   [provider]  insulin detemir (LEVEMIR) 100 UNIT/ML injection Inject 23-27 Units into the skin 2 (two) times daily. 27 units qam and 23 units qhs    [provider]  lovastatin (MEVACOR) 40 MG tablet Take 40 mg by mouth at bedtime.  04/05/15   [provider]  metFORMIN (GLUCOPHAGE) 500 MG tablet Take 1,000 mg by mouth 2 (two) times daily. 03/12/17   [provider]  metoprolol succinate (TOPROL-XL) 25 MG 24 hr tablet Take 25 mg by mouth 2 (two) times daily.  03/26/15   [provider]    Family History Family History  Problem Relation Age of Onset  . Prostate cancer Brother   . Prostate cancer Brother   . Hypertension Mother   . Heart failure Father     Social History Social History   Tobacco Use  . Smoking status: Former Smoker    Packs/day: 3.00  Years: 30.00    Pack years: 90.00    Types: Cigarettes    Last attempt to quit: 10/30/1990    Years since quitting: 28.0  . Smokeless tobacco: Never Used  Substance Use Topics  . Alcohol use: No    Alcohol/week: 0.0 standard drinks  . Drug use: No     Allergies   Patient has no known allergies.   Review of Systems Review of Systems  Respiratory: Positive for cough and shortness of breath.   Musculoskeletal:       Chest pain, rib pain.   Physical Exam Triage Vital Signs ED Triage Vitals  Enc Vitals Group     BP 10/31/18 1343 (!) 149/79     Pulse Rate 10/31/18 1343 81     Resp 10/31/18 1343 18     Temp 10/31/18  1343 98.3 F (36.8 C)     Temp Source 10/31/18 1343 Oral     SpO2 10/31/18 1343 96 %     Weight 10/31/18 1344 185 lb (83.9 kg)     Height 10/31/18 1344 6\' 4"  (1.93 m)     Head Circumference --      Peak Flow --      Pain Score 10/31/18 1445 4     Pain Loc --      Pain Edu? --      Excl. in [place]? --    Updated Vital Signs BP (!) 149/79 (BP Location: Left Arm)   Pulse 81   Temp 98.3 F (36.8 C) (Oral)   Resp 18   Ht 6\' 4"  (1.93 m)   Wt 83.9 kg   SpO2 96%   BMI 22.52 kg/m   Visual Acuity Right Eye Distance:   Left Eye Distance:   Bilateral Distance:    Right Eye Near:   Left Eye Near:    Bilateral Near:     Physical Exam Vitals signs and nursing note reviewed.  Constitutional:      General: He is not in acute distress. HENT:     Head: Normocephalic and atraumatic.     Nose: Nose normal.  Eyes:     General: No scleral icterus.    Conjunctiva/sclera: Conjunctivae normal.  Cardiovascular:     Rate and Rhythm: Normal rate and regular rhythm.  Pulmonary:     Effort: Pulmonary effort is normal.     Breath sounds: Normal breath sounds.  Musculoskeletal:     Comments: Patient with right-sided rib tenderness to palpation.  Neurological:     Mental Status: He is alert.  Psychiatric:        Mood and Affect: Mood normal.        Behavior: Behavior normal.    UC Treatments / Results  Labs (all labs ordered are listed, but only abnormal results are displayed) Labs Reviewed - No data to display  EKG None  Radiology Dg Chest 2 View  Result Date: 10/31/2018 CLINICAL DATA:  Cough and congestion.  Recent rib fractures EXAM: CHEST - 2 VIEW COMPARISON:  10/17/2018 chest CT FINDINGS: Low lung volumes on the right with streaky density and small pleural effusion. Multiple right rib fractures. IMPRESSION: Numerous recent right rib fractures with volume loss, atelectasis, and trace effusion. Electronically Signed   By: Monte Fantasia M.D.   On: 10/31/2018 14:10     Procedures Procedures (including critical care time)  Medications Ordered in UC Medications - No data to display  Initial Impression / Assessment and Plan / UC Course  I have  reviewed the triage vital signs and the nursing notes.  Pertinent labs & imaging results that were available during my care of the patient were reviewed by me and considered in my medical decision making (see chart for details).    78 year old male with recent NSTEMI and fall resulting in multiple rib fractures presents with cough, intermittent shortness of breath, chest/rib pain.  Chest x-ray obtained to ensure no underlying pneumonia.  It was negative for pneumonia.  Advised continued use of incentive spirometer.  Pain medication as prescribed.  Supportive care.  Final Clinical Impressions(s) / UC Diagnoses   Final diagnoses:  Closed fracture of multiple ribs of right side with routine healing, subsequent encounter     Discharge Instructions     No pneumonia.  Pain medication as prescribed.  Take care  Dr. Lacinda Axon     ED Prescriptions    None     Controlled Substance Prescriptions [place] Controlled Substance Registry consulted? Not Applicable   Coral Spikes, DO 10/31/18 1542

## 2019-09-10 ENCOUNTER — Ambulatory Visit
Admission: RE | Admit: 2019-09-10 | Discharge: 2019-09-10 | Disposition: A | Discharge status: Home / Self Care | Payer: Medicare Other | Source: Ambulatory Visit | Attending: Family Medicine | Admitting: Family Medicine

## 2019-09-10 ENCOUNTER — Other Ambulatory Visit: Payer: Self-pay

## 2019-09-10 ENCOUNTER — Encounter (INDEPENDENT_AMBULATORY_CARE_PROVIDER_SITE_OTHER): Payer: Self-pay

## 2019-09-10 ENCOUNTER — Other Ambulatory Visit: Payer: Self-pay | Admitting: Family Medicine

## 2019-09-10 DIAGNOSIS — I8001 Phlebitis and thrombophlebitis of superficial vessels of right lower extremity: Secondary | ICD-10-CM

## 2019-09-10 DIAGNOSIS — R2241 Localized swelling, mass and lump, right lower limb: Secondary | ICD-10-CM

## 2020-01-13 IMAGING — CT CT CERVICAL SPINE W/O CM
4 of 7 series · 14 of 33 positions shown, 15 images · non-contrast
Comparison: None.

CLINICAL DATA: Patient status post fall from [place].

EXAM:
CT HEAD WITHOUT CONTRAST
CT CERVICAL SPINE WITHOUT CONTRAST
TECHNIQUE: Multidetector CT imaging of the head and cervical spine was
performed following the standard protocol without intravenous
contrast. Multiplanar CT image reconstructions of the cervical spine
were also generated.

[Series 4: coronal soft tissue · coronal · 0.31mm/px · 3 of 65 slices shown]
[im 17/65  bone]
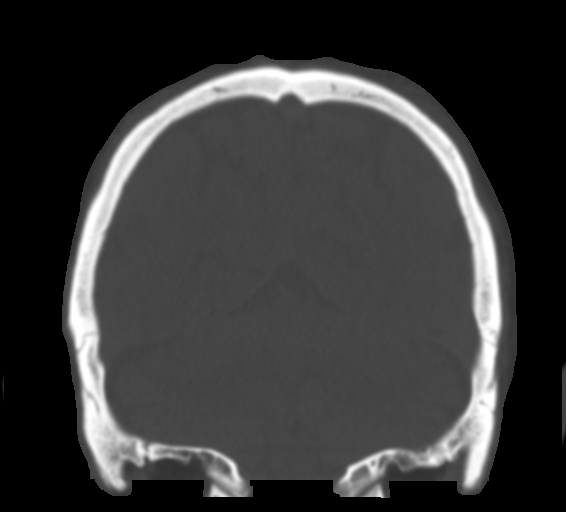
[im 33/65  bone]
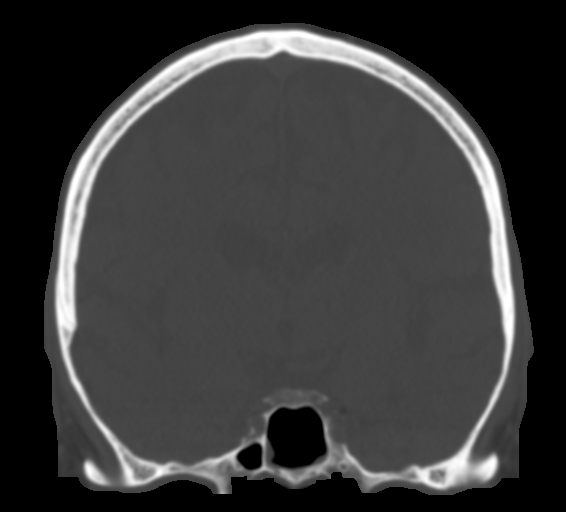
[im 49/65  bone]
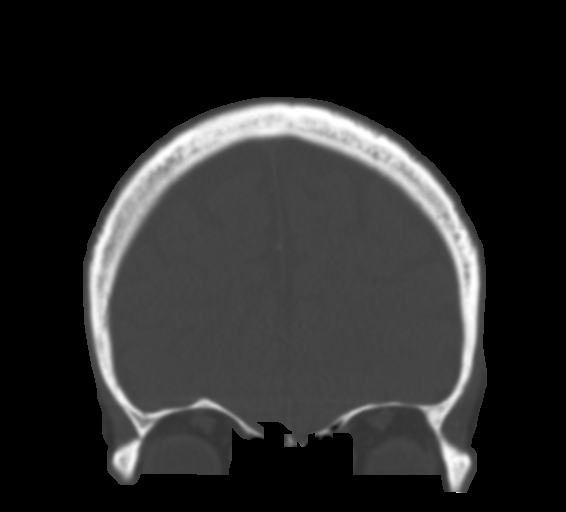

[Series 7: c spine soft · axial · 0.36mm/px · z∈[-274,-234]mm · 2 of 101 slices shown]
[im 21/101  soft-tissue]
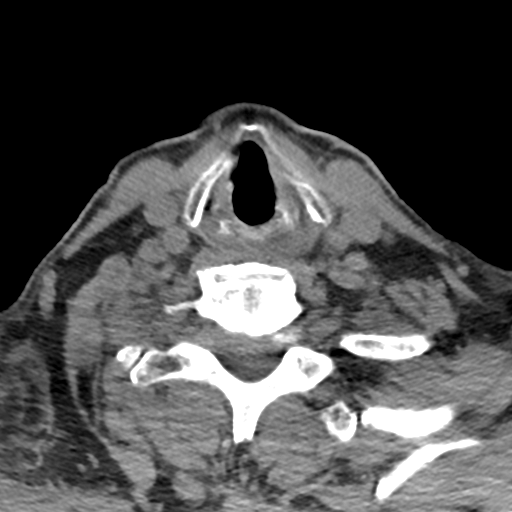
[im 41/101  soft-tissue]
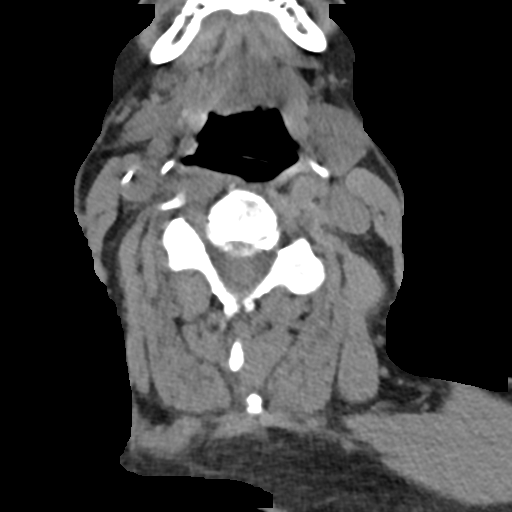

[Series 8: sagittal bone · sagittal · 0.29mm/px · 5 of 56 slices shown]
[im 10/56  bone]
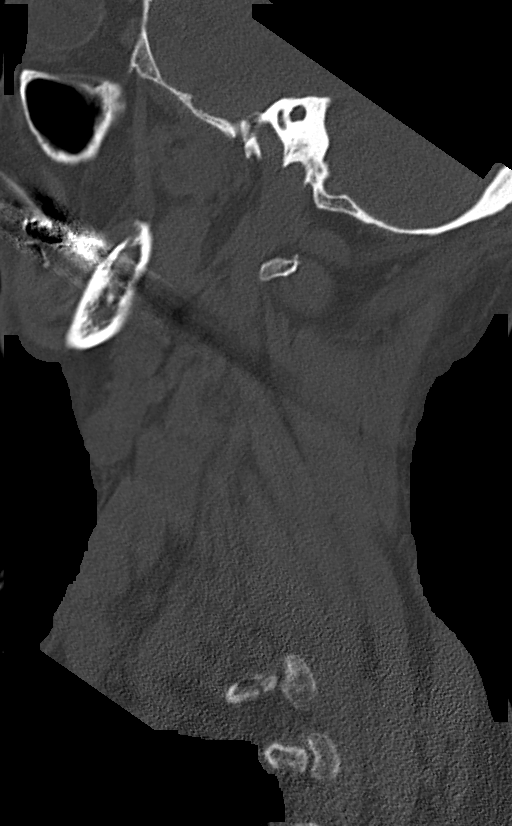
[im 19/56  bone]
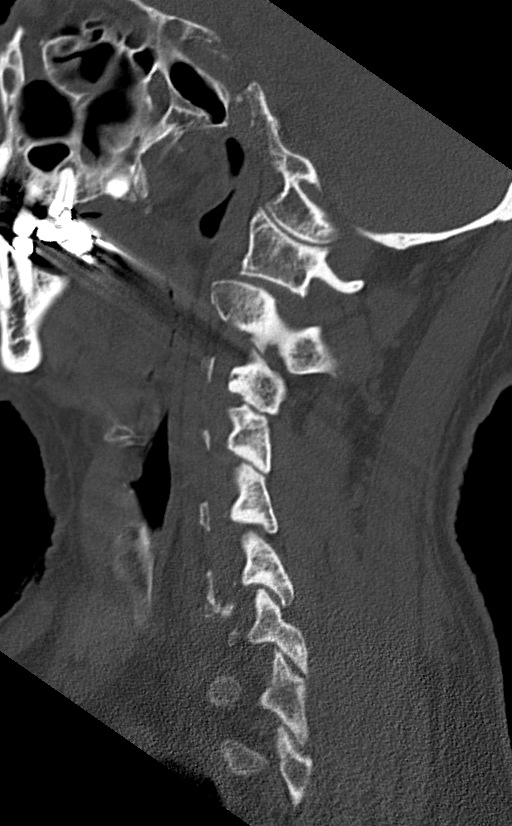
[im 28/56  bone]
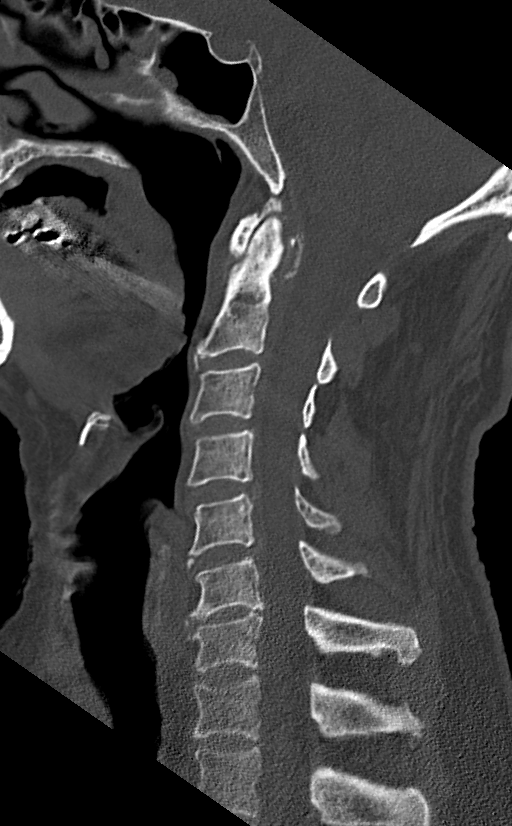
[im 37/56  bone]
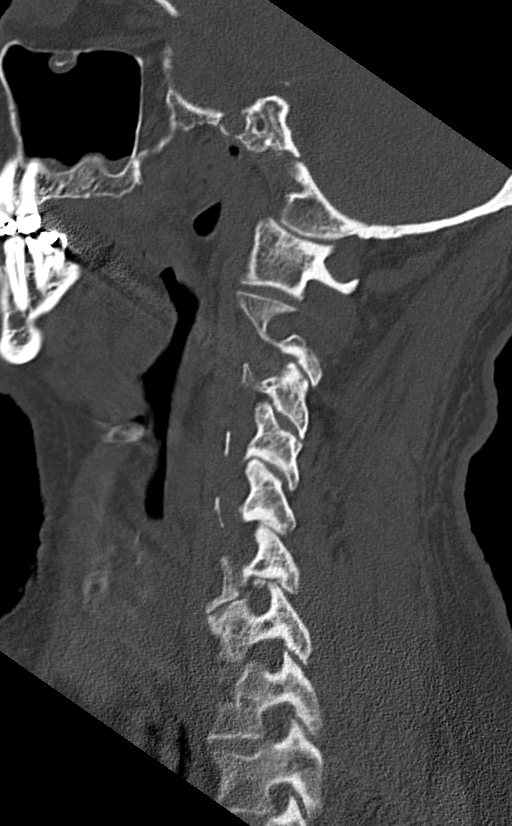
[im 46/56  bone]
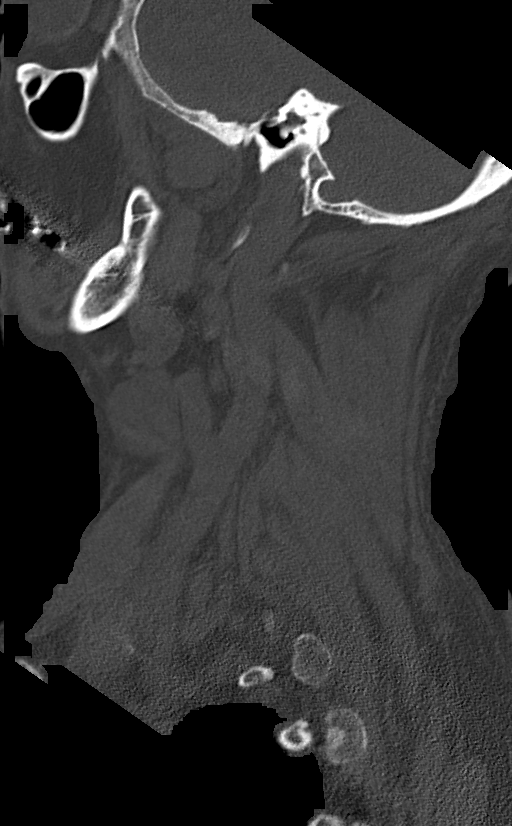

[Series 10: orthogonal bone · axial · 0.23mm/px · z∈[-303,-198]mm · 4 of 104 slices shown, 5 images]
[im 21/104  soft-tissue]
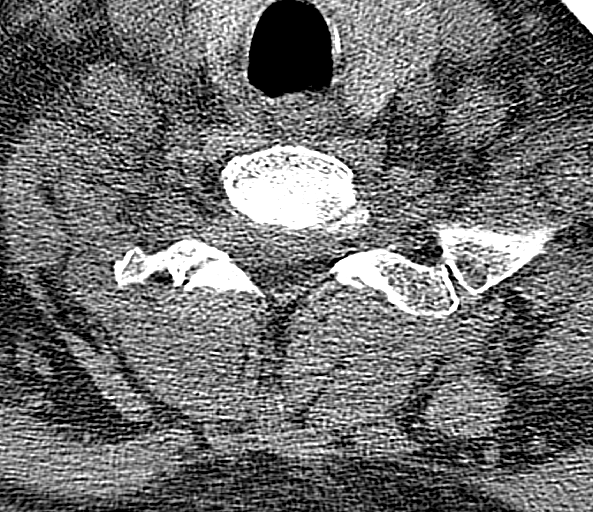
[im 21/104  bone]
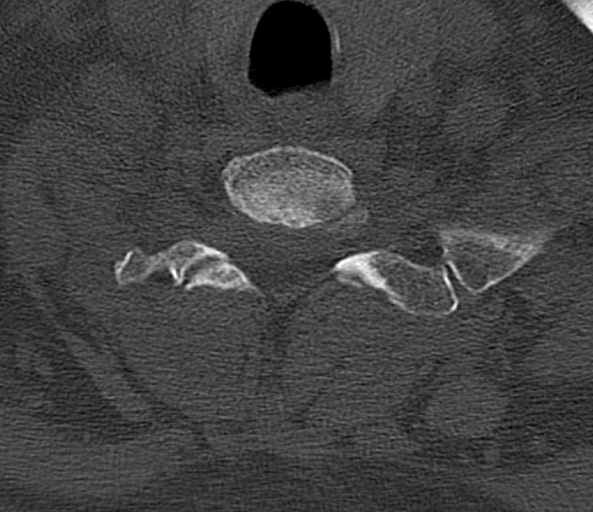
[im 42/104  bone]
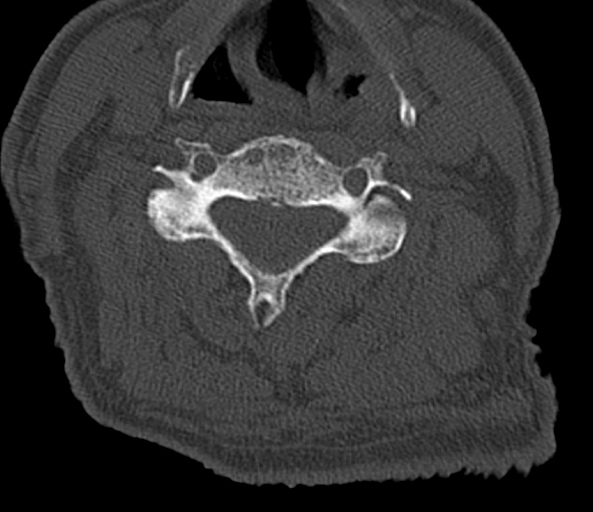
[im 62/104  bone]
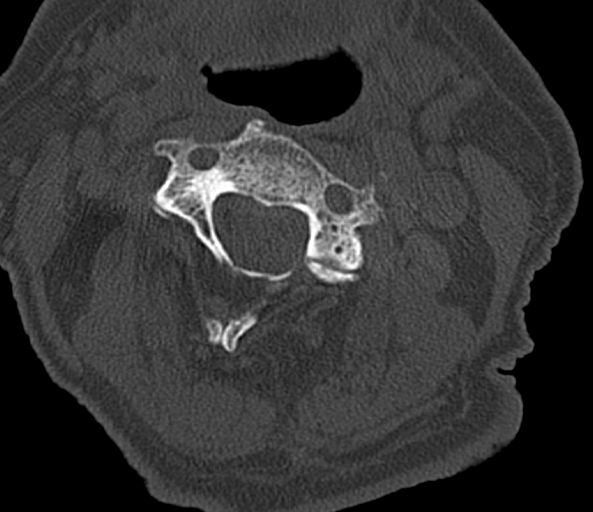
[im 83/104  bone]
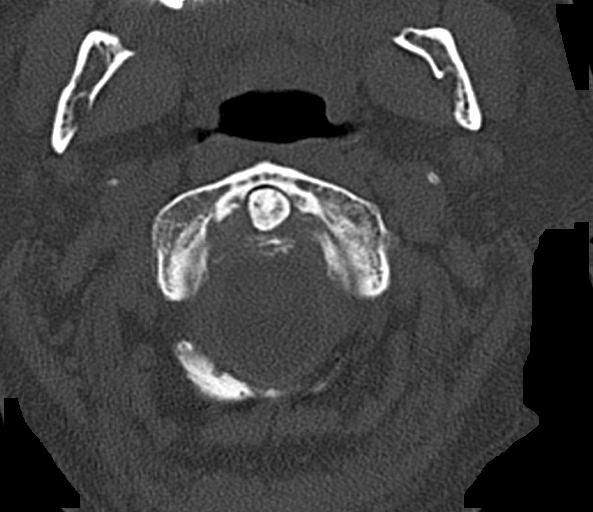

[14 of 33 positions shown; findings below may reference images not displayed]

FINDINGS: CT HEAD FINDINGS

Brain: Ventricles and sulci are prominent compatible with atrophy.
Periventricular and subcortical white matter hypodensity compatible
with chronic microvascular ischemic changes. No evidence for acute
cortically based infarct, intracranial hemorrhage, mass lesion or
mass-effect.

Vascular: Unremarkable

Skull: Intact.

Sinuses/Orbits: Coastal thickening involving the ethmoid air cells.
Mastoid air cells are unremarkable. Frontal osteoma.

Other: None.

CT CERVICAL SPINE FINDINGS

Alignment: Straightening of the normal cervical lordosis.

Skull base and vertebrae: No acute fracture. No primary bone lesion
or focal pathologic process.

Soft tissues and spinal canal: No prevertebral fluid or swelling. No
visible canal hematoma.

Disc levels:  Degenerative disc disease most pronounced C6-7.

Upper chest: Nondisplaced posterior right first and second rib
fractures.

Other: None.
IMPRESSION: No acute intracranial process.

No acute cervical spine fracture.

Nondisplaced posterior right first and second rib fractures. See
dedicated CT CAP report.

## 2021-01-18 ENCOUNTER — Other Ambulatory Visit: Payer: Self-pay

## 2021-01-18 ENCOUNTER — Ambulatory Visit (INDEPENDENT_AMBULATORY_CARE_PROVIDER_SITE_OTHER): Payer: Medicare Other

## 2021-01-18 ENCOUNTER — Ambulatory Visit
Admission: EM | Admit: 2021-01-18 | Discharge: 2021-01-18 | Disposition: A | Discharge status: Home / Self Care | Payer: Medicare Other

## 2021-01-18 DIAGNOSIS — W11XXXA Fall on and from ladder, initial encounter: Secondary | ICD-10-CM

## 2021-01-18 DIAGNOSIS — T148XXA Other injury of unspecified body region, initial encounter: Secondary | ICD-10-CM | POA: Diagnosis not present

## 2021-01-18 DIAGNOSIS — S5012XA Contusion of left forearm, initial encounter: Secondary | ICD-10-CM

## 2021-01-18 DIAGNOSIS — Z23 Encounter for immunization: Secondary | ICD-10-CM | POA: Diagnosis not present

## 2021-01-18 MED ORDER — TETANUS-DIPHTH-ACELL PERTUSSIS 5-2.5-18.5 LF-MCG/0.5 IM SUSY
0.5000 mL | PREFILLED_SYRINGE | Freq: Once | INTRAMUSCULAR | Status: AC
  Administered 2021-01-18: 0.5 mL via INTRAMUSCULAR

## 2021-01-18 MED ORDER — CEPHALEXIN 500 MG PO CAPS: 500.0000 mg | ORAL_CAPSULE | Freq: Two times a day (BID) | ORAL | 0 refills | Status: AC

## 2021-01-18 MED ORDER — TETANUS-DIPHTHERIA TOXOIDS TD 5-2 LFU IM INJ: 0.5000 mL | INJECTION | Freq: Once | INTRAMUSCULAR | Status: DC

## 2021-01-18 NOTE — ED Provider Notes (Signed)
[facility]    CSN: [id number] Arrival date & time: 01/18/21  1858      History   Chief Complaint Chief Complaint  Patient presents with  . Abrasion    HPI [facility] is a 80 y.o. male.   HPI   80 year old male here for evaluation of injury to his left forearm.  Patient reports that he was working at home putting some finishing touches on his shed before putting on a new roof when the ladder he was standing on slipped out from underneath him and he hit his left forearm on the way down.  Patient states that he was able to get back up and did not initially noticed that there was any injury.  He noticed a skin tear on his left wrist and then some stiffness to the short of his left arm.  When he lifts up the shirt he noticed that he had a bunch of skin tears on his forearm.  He washed his wounds with an outside hose and well water and then continue to work the rest of the day.  The accident happened at 1:30 PM this afternoon.  Patient denies hitting his head or loss of consciousness.  Patient has no numbness, tingling, or weakness in his left forearm and he has full range of motion of his left arm.  Unsure when last tetanus shot was.  Past Medical History:  Diagnosis Date  . Anxiety   . Coronary artery disease   . Diabetes mellitus without complication ([place])   . Hypertension   . Reflux   . Thyroid disease     Patient Active Problem List   Diagnosis Date Noted  . Gastroenteritis 03/24/2017    Past Surgical History:  Procedure Laterality Date  . CHOLECYSTECTOMY  2000  . CHOLECYSTECTOMY    . COLONOSCOPY WITH PROPOFOL N/A 06/29/2015   Procedure: COLONOSCOPY WITH PROPOFOL;  Surgeon: Lollie Sails, MD;  Location: [facility] ENDOSCOPY;  Service: Endoscopy;  Laterality: N/A;  . CORONARY ANGIOPLASTY    . ESOPHAGOGASTRODUODENOSCOPY (EGD) WITH PROPOFOL N/A 06/29/2015   Procedure: ESOPHAGOGASTRODUODENOSCOPY (EGD) WITH PROPOFOL;  Surgeon: Lollie Sails, MD;  Location:  [facility] ENDOSCOPY;  Service: Endoscopy;  Laterality: N/A;       Home Medications    Prior to Admission medications   Medication Sig Start Date End Date Taking? Authorizing Provider  aspirin EC 81 MG tablet Take 81 mg by mouth every evening.    Yes [provider]  atorvastatin (LIPITOR) 80 MG tablet Take 80 mg by mouth at bedtime. 07/15/15  Yes [provider]  cephALEXin (KEFLEX) 500 MG capsule Take 1 capsule (500 mg total) by mouth 2 (two) times daily for 5 days. 01/18/21 01/23/21 Yes Margarette Canada, NP  cetirizine (ZYRTEC) 10 MG tablet Take 10 mg by mouth daily.   Yes [provider]  clopidogrel (PLAVIX) 75 MG tablet Take 75 mg by mouth daily.   Yes [provider]  donepezil (ARICEPT) 5 MG tablet Take 5 mg by mouth daily. 01/11/21  Yes [provider]  enalapril (VASOTEC) 10 MG tablet Take 10 mg by mouth daily.  04/05/15  Yes [provider]  gabapentin (NEURONTIN) 600 MG tablet Take 600 mg by mouth 2 (two) times daily.  04/20/15  Yes [provider]  insulin detemir (LEVEMIR) 100 UNIT/ML injection Inject 23-27 Units into the skin 2 (two) times daily. 27 units qam and 23 units qhs   Yes [provider]  losartan (COZAAR)  25 MG tablet Take 25 mg by mouth daily. 01/14/21  Yes [provider]  lovastatin (MEVACOR) 40 MG tablet Take 40 mg by mouth at bedtime.  04/05/15  Yes [provider]  metFORMIN (GLUCOPHAGE) 500 MG tablet Take 1,000 mg by mouth 2 (two) times daily. 03/12/17  Yes [provider]  metoprolol succinate (TOPROL-XL) 25 MG 24 hr tablet Take 25 mg by mouth 2 (two) times daily.  03/26/15  Yes [provider]    Family History Family History  Problem Relation Age of Onset  . Prostate cancer Brother   . Prostate cancer Brother   . Hypertension Mother   . Heart failure Father     Social History Social History   Tobacco Use  . Smoking status: Former Smoker    Packs/day: 3.00     Years: 30.00    Pack years: 90.00    Types: Cigarettes    Quit date: 10/30/1990    Years since quitting: 30.2  . Smokeless tobacco: Never Used  Substance Use Topics  . Alcohol use: No    Alcohol/week: 0.0 standard drinks  . Drug use: No     Allergies   Patient has no known allergies.   Review of Systems Review of Systems  Constitutional: Negative for activity change and appetite change.  Musculoskeletal: Negative for arthralgias, joint swelling and myalgias.  Skin: Positive for color change and wound.  Neurological: Negative for dizziness and syncope.  Psychiatric/Behavioral: Negative.      Physical Exam Triage Vital Signs ED Triage Vitals  Enc Vitals Group     BP 01/18/21 1907 123/69     Pulse Rate 01/18/21 1907 72     Resp 01/18/21 1907 18     Temp 01/18/21 1907 98.4 F (36.9 C)     Temp Source 01/18/21 1907 Oral     SpO2 01/18/21 1907 95 %     Weight 01/18/21 1908 190 lb (86.2 kg)     Height 01/18/21 1908 6\' 4"  (1.93 m)     Head Circumference --      Peak Flow --      Pain Score 01/18/21 1908 0     Pain Loc --      Pain Edu? --      Excl. in [place]? --    No data found.  Updated Vital Signs BP 123/69 (BP Location: Left Arm)   Pulse 72   Temp 98.4 F (36.9 C) (Oral)   Resp 18   Ht 6\' 4"  (1.93 m)   Wt 190 lb (86.2 kg)   SpO2 95%   BMI 23.13 kg/m   Visual Acuity Right Eye Distance:   Left Eye Distance:   Bilateral Distance:    Right Eye Near:   Left Eye Near:    Bilateral Near:     Physical Exam Vitals and nursing note reviewed.  Constitutional:      General: He is not in acute distress.    Appearance: Normal appearance. He is normal weight. He is not ill-appearing.  HENT:     Head: Normocephalic and atraumatic.  Musculoskeletal:        General: Signs of injury present. No swelling, tenderness or deformity. Normal range of motion.  Skin:    General: Skin is warm and dry.     Capillary Refill: Capillary refill takes less than 2 seconds.      Findings: Bruising and lesion present.  Neurological:     General: No focal deficit present.  Mental Status: He is alert and oriented to person, place, and time.  Psychiatric:        Mood and Affect: Mood normal.        Behavior: Behavior normal.        Thought Content: Thought content normal.        Judgment: Judgment normal.      UC Treatments / Results  Labs (all labs ordered are listed, but only abnormal results are displayed) Labs Reviewed - No data to display  EKG   Radiology No results found.  Procedures Procedures (including critical care time)  Medications Ordered in UC Medications  Tdap (BOOSTRIX) injection 0.5 mL (0.5 mLs Intramuscular Given 01/18/21 1943)    Initial Impression / Assessment and Plan / UC Course  I have reviewed the triage vital signs and the nursing notes.  Pertinent labs & imaging results that were available during my care of the patient were reviewed by me and considered in my medical decision making (see chart for details).   Is a very pleasant 80 year old male here for evaluation of injury to his right forearm that happened after he fell off of a ladder this afternoon.  The latter was approximately 4 feet in the air when it went out from underneath him and he fell.  Physical exam reveals multiple superficial skin tears to the volar surface of the left forearm with underlying ecchymosis.  Patient has 5 out of 5 grip strength in left forearm and full sensation.  Patient has full range of motion of his fingers, hand, and wrist.  Radial and ulnar pulses are 2+.  Cap refill is less than 2 seconds.  Bleeding is controlled.  Patient also has full range of motion without crepitus or tenderness of his left elbow.  There is no tenderness when palpating other the medial lateral condyle, the olecranon process, or the radial head.  Will obtain radiograph of left forearm just to rule out fracture.  Will clean wounds and dressed them with bacitracin and  nonadherent dressing.  Will update patient's tetanus.  Will discharge patient home on Keflex twice daily for  5 days for prophylactic antibiotic treatment.  Forearm x-rays are negative for fracture or dislocation.  We will discharge patient home with diagnosis of skin tears and contusion of the left forearm.  Will put on Keflex twice daily for 5 days for infection prophylaxis.  Final Clinical Impressions(s) / UC Diagnoses   Final diagnoses:  Multiple skin tears  Contusion of left forearm, initial encounter     Discharge Instructions     Keep the skin tears on your left forearm clean and dry.  Wash the wounds daily with warm water and soap, pat them dry, apply a thin smear of bacitracin and reapply a nonstick dressing until a scab forms.  Once a scab formed you can stop applying bacitracin and you can leave the area open to the air while at home and cover with a bandage when out in public or working.  Take the Keflex twice daily for 5 days to prevent infection of your wound.  Take over-the-counter Tylenol as needed for pain.  Return for reevaluation if you develop any increased pain in your left forearm, warmth, fever, drainage from the wounds, or red streaks going up your arm.    ED Prescriptions    Medication Sig Dispense Auth. Provider   cephALEXin (KEFLEX) 500 MG capsule Take 1 capsule (500 mg total) by mouth 2 (two) times daily for 5 days. 10  capsule Margarette Canada, NP     PDMP not reviewed this encounter.   Margarette Canada, NP 01/18/21 2011

## 2021-01-18 NOTE — ED Triage Notes (Signed)
Patient states that today around 130pm he fell and skidded off a ladder. States that he banged his left arm on the way down. Patient with multiple skin tears to arm with some actively bleeding.

## 2021-01-18 NOTE — ED Notes (Signed)
Wrapped left arm with bacitracin, non adherent, kerlex and coban. Patient tolerated well.

## 2021-01-18 NOTE — Discharge Instructions (Addendum)
Keep the skin tears on your left forearm clean and dry.  Wash the wounds daily with warm water and soap, pat them dry, apply a thin smear of bacitracin and reapply a nonstick dressing until a scab forms.  Once a scab formed you can stop applying bacitracin and you can leave the area open to the air while at home and cover with a bandage when out in public or working.  Take the Keflex twice daily for 5 days to prevent infection of your wound.  Take over-the-counter Tylenol as needed for pain.  Return for reevaluation if you develop any increased pain in your left forearm, warmth, fever, drainage from the wounds, or red streaks going up your arm.

## 2022-05-16 ENCOUNTER — Ambulatory Visit: Payer: Medicare Other | Admitting: Urology

## 2022-05-16 ENCOUNTER — Encounter: Payer: Self-pay | Admitting: Urology

## 2022-05-16 ENCOUNTER — Other Ambulatory Visit
Admission: RE | Admit: 2022-05-16 | Discharge: 2022-05-16 | Disposition: A | Discharge status: Home / Self Care | Payer: Medicare Other | Attending: Urology | Admitting: Urology

## 2022-05-16 VITALS — BP 124/64 | HR 62 | Ht 76.0 in | Wt 174.0 lb

## 2022-05-16 DIAGNOSIS — R399 Unspecified symptoms and signs involving the genitourinary system: Secondary | ICD-10-CM | POA: Diagnosis not present

## 2022-05-16 DIAGNOSIS — R972 Elevated prostate specific antigen [PSA]: Secondary | ICD-10-CM | POA: Insufficient documentation

## 2022-05-16 DIAGNOSIS — Z8042 Family history of malignant neoplasm of prostate: Secondary | ICD-10-CM

## 2022-05-16 LAB — URINALYSIS, COMPLETE (UACMP) WITH MICROSCOPIC
Bilirubin Urine: NEGATIVE
Glucose, UA: >1000 mg/dL — AB
Hgb urine dipstick: NEGATIVE
Ketones, ur: NEGATIVE mg/dL
Leukocytes,Ua: NEGATIVE
Nitrite: NEGATIVE
Specific Gravity, Urine: 1.01 (ref 1.005–1.030)
pH: 5.5 (ref 5.0–8.0)

## 2022-05-16 NOTE — Progress Notes (Signed)
05/16/22 1:20 PM   Jorge Bradford [birth year] [id number]  CC: Elevated PSA  HPI: Frail 81 year old male here with his wife today who provides much of the history.  His past medical history is notable for CAD and diabetes, as well as family history of prostate cancer in his brothers x2 and a son.  He recently had a PSA checked with PCP that was mildly elevated at 7.2.  This had increased from prior values of 2.7 in May 2018, and 2.4 in June 2016.  Prostate measured 60 g on CT from 2019.  He has had some problems with incontinence and urinary symptoms.  It sounds like he is also had some increased confusion and forgetfulness that may be related to some dementia.   PMH: Past Medical History:  Diagnosis Date   Anxiety    Coronary artery disease    Diabetes mellitus without complication ([place])    Hypertension    Reflux    Thyroid disease     Surgical History: Past Surgical History:  Procedure Laterality Date   CHOLECYSTECTOMY  2000   CHOLECYSTECTOMY     COLONOSCOPY WITH PROPOFOL N/A 06/29/2015   Procedure: COLONOSCOPY WITH PROPOFOL;  Surgeon: Lollie Sails, MD;  Location: [facility] ENDOSCOPY;  Service: Endoscopy;  Laterality: N/A;   CORONARY ANGIOPLASTY     ESOPHAGOGASTRODUODENOSCOPY (EGD) WITH PROPOFOL N/A 06/29/2015   Procedure: ESOPHAGOGASTRODUODENOSCOPY (EGD) WITH PROPOFOL;  Surgeon: Lollie Sails, MD;  Location: [facility] ENDOSCOPY;  Service: Endoscopy;  Laterality: N/A;     Family History: Family History  Problem Relation Age of Onset   Prostate cancer Brother    Prostate cancer Brother    Hypertension Mother    Heart failure Father     Social History:  reports that he quit smoking about 31 years ago. His smoking use included cigarettes. He has a 90.00 pack-year smoking history. He has been exposed to tobacco smoke. He has never used smokeless tobacco. He reports that he does not drink alcohol and does not use drugs.  Physical Exam: BP 124/64   Pulse 62   Ht '6\' 4"'$   (1.93 m)   Wt 174 lb (78.9 kg)   BMI 21.18 kg/m    Constitutional:  Alert and oriented, No acute distress. Cardiovascular: No clubbing, cyanosis, or edema. Respiratory: Normal respiratory effort, no increased work of breathing. GI: Abdomen is soft, nontender, nondistended, no abdominal masses DRE: 60 g, smooth, no nodules or masses  Laboratory Data: PSA history reviewed, see HPI  Pertinent Imaging: I have personally viewed and interpreted the CT abdomen and pelvis with contrast from December 2019 showing a 60 g prostate, no other urologic abnormalities.  Assessment & Plan:   Comorbid 81 year old male recently found to have a mildly elevated PSA of 7.2, from 2.7 in 2018, and 2.4 in 2016.  He does have a family history of prostate cancer in his 2 brothers as well as his son.  We reviewed the AUA guidelines that do not recommend routine screening in men over age 48, and especially in men with other comorbidities.  We discussed possible causes of elevated PSA including BPH, inflammation, infection, false elevation, or prostate cancer.  We also discussed possible further evaluation of his isolated mildly elevated PSA including repeat PSA, prostate MRI, prostate biopsy, or 4K score.  I think with his comorbidities and frailty, we we will try to avoid biopsy unless absolutely necessary.  Reassuring benign DRE today, and PSA density is reassuring based on 60 g prostate from 2019.  -  Urinalysis and culture today to evaluate for infection, call with results -RTC 2 to 3 months with repeat PSA reflex to free prior.  If PSA remains elevated consider 4K score to restratify prior to considering any invasive procedures    Nickolas Madrid, MD 05/16/2022  [facility] [address] [place], [place] [address] [phone]

## 2022-05-16 NOTE — Patient Instructions (Signed)

## 2022-05-17 ENCOUNTER — Telehealth: Payer: Self-pay

## 2022-05-17 LAB — FPSA% REFLEX
% FREE PSA: 17.6 %
PSA, FREE: 0.9 ng/mL

## 2022-05-17 LAB — URINE CULTURE: Culture: NO GROWTH

## 2022-05-17 LAB — PSA (REFLEX TO FREE) (SERIAL): Prostate Specific Ag, Serum: 5.1 ng/mL — ABNORMAL HIGH (ref 0.0–4.0)

## 2022-05-17 NOTE — Telephone Encounter (Signed)
Called pt no answer. Left detailed message per DPR. Advised pt to call back for questions or concerns.

## 2022-05-17 NOTE — Telephone Encounter (Signed)
-----   Message from Billey Co, MD sent at 05/17/2022 10:50 AM EDT ----- Dor[facility] Devoid news, PSA down to 5.1 which is normal for his age.  With his other medical problems would not recommend any further PSA screening, can cancel his follow-up for 2 to 3 months  Nickolas Madrid, MD 05/17/2022

## 2023-04-14 ENCOUNTER — Emergency Department
Admission: EM | Admit: 2023-04-14 | Discharge: 2023-04-15 | Disposition: A | Discharge status: Home / Self Care | Payer: Medicare Other | Attending: Emergency Medicine | Admitting: Emergency Medicine

## 2023-04-14 DIAGNOSIS — R739 Hyperglycemia, unspecified: Secondary | ICD-10-CM | POA: Diagnosis present

## 2023-04-14 DIAGNOSIS — N179 Acute kidney failure, unspecified: Secondary | ICD-10-CM | POA: Insufficient documentation

## 2023-04-14 DIAGNOSIS — R197 Diarrhea, unspecified: Secondary | ICD-10-CM | POA: Insufficient documentation

## 2023-04-14 DIAGNOSIS — R112 Nausea with vomiting, unspecified: Secondary | ICD-10-CM | POA: Insufficient documentation

## 2023-04-14 DIAGNOSIS — R109 Unspecified abdominal pain: Secondary | ICD-10-CM | POA: Diagnosis not present

## 2023-04-14 DIAGNOSIS — E871 Hypo-osmolality and hyponatremia: Secondary | ICD-10-CM | POA: Insufficient documentation

## 2023-04-14 NOTE — ED Triage Notes (Signed)
[facility] BIB [facility] from home for hyperglycemia. Patient reports his BG are usually around 200's and all day today BGs have been 350-400's. Patient reports vomiting and diarrhea this AM that has since resolved.  EMS- BG 322, 113/63,98.88f, 96% RA, 76. Patient denies any pain at this time

## 2023-04-15 ENCOUNTER — Emergency Department: Payer: Medicare Other

## 2023-04-15 LAB — CBG MONITORING, ED: Glucose-Capillary: 295 mg/dL — ABNORMAL HIGH (ref 70–99)

## 2023-04-15 LAB — CBC WITH DIFFERENTIAL/PLATELET
Abs Immature Granulocytes: 0.07 10*3/uL (ref 0.00–0.07)
Basophils Absolute: 0.1 10*3/uL (ref 0.0–0.1)
Basophils Relative: 0 %
Eosinophils Absolute: 0 10*3/uL (ref 0.0–0.5)
Eosinophils Relative: 0 %
HCT: 38.5 % — ABNORMAL LOW (ref 39.0–52.0)
Hemoglobin: 12.3 g/dL — ABNORMAL LOW (ref 13.0–17.0)
Immature Granulocytes: 0 %
Lymphocytes Relative: 9 %
Lymphs Abs: 1.5 10*3/uL (ref 0.7–4.0)
MCH: 30 pg (ref 26.0–34.0)
MCHC: 31.9 g/dL (ref 30.0–36.0)
MCV: 93.9 fL (ref 80.0–100.0)
Monocytes Absolute: 1.2 10*3/uL — ABNORMAL HIGH (ref 0.1–1.0)
Monocytes Relative: 7 %
Neutro Abs: 14 10*3/uL — ABNORMAL HIGH (ref 1.7–7.7)
Neutrophils Relative %: 84 %
Platelets: 203 10*3/uL (ref 150–400)
RBC: 4.1 MIL/uL — ABNORMAL LOW (ref 4.22–5.81)
RDW: 13.8 % (ref 11.5–15.5)
WBC: 16.9 10*3/uL — ABNORMAL HIGH (ref 4.0–10.5)
nRBC: 0 % (ref 0.0–0.2)

## 2023-04-15 LAB — URINALYSIS, ROUTINE W REFLEX MICROSCOPIC
Bilirubin Urine: NEGATIVE
Glucose, UA: >=500 mg/dL — AB
Hgb urine dipstick: NEGATIVE
Ketones, ur: NEGATIVE mg/dL
Leukocytes,Ua: NEGATIVE
Nitrite: NEGATIVE
Protein, ur: NEGATIVE mg/dL
Specific Gravity, Urine: 1.011 (ref 1.005–1.030)
pH: 5 (ref 5.0–8.0)

## 2023-04-15 LAB — BASIC METABOLIC PANEL
Anion gap: 10 (ref 5–15)
BUN: 36 mg/dL — ABNORMAL HIGH (ref 8–23)
CO2: 20 mmol/L — ABNORMAL LOW (ref 22–32)
Calcium: 7.9 mg/dL — ABNORMAL LOW (ref 8.9–10.3)
Chloride: 97 mmol/L — ABNORMAL LOW (ref 98–111)
Creatinine, Ser: 1.87 mg/dL — ABNORMAL HIGH (ref 0.61–1.24)
GFR, Estimated: 35 mL/min — ABNORMAL LOW (ref >60)
Glucose, Bld: 334 mg/dL — ABNORMAL HIGH (ref 70–99)
Potassium: 5 mmol/L (ref 3.5–5.1)
Sodium: 127 mmol/L — ABNORMAL LOW (ref 135–145)

## 2023-04-15 MED ORDER — SODIUM CHLORIDE 0.9 % IV BOLUS
1000.0000 mL | Freq: Once | INTRAVENOUS | Status: AC
  Administered 2023-04-15: 1000 mL via INTRAVENOUS

## 2023-04-15 MED ORDER — ONDANSETRON 4 MG PO TBDP: ORAL_TABLET | ORAL | 0 refills | Status: AC

## 2023-04-15 NOTE — Discharge Instructions (Signed)

## 2023-04-15 NOTE — ED Provider Notes (Addendum)
[facility] Provider Note    Event Date/Time   First MD Initiated Contact with Patient 04/14/23 2322     (approximate)   History   Hyperglycemia   HPI Jorge Bradford is a 82 y.o. male who presents by EMS for high blood sugar.  His family is at bedside and provided additional history because the patient is hard of hearing.  He is on insulin and typically his blood glucose is well-controlled.  However about 24 hours ago he started having multiple episodes of nausea, vomiting, and diarrhea.  Those symptoms all resolved and he has not vomited for more than 12 hours, and he has not not had any persistent diarrhea.  He had some abdominal cramping during the times he was having diarrhea but he said he is having no pain now or before.  He has had no chest pain or shortness of breath.  No lightheadedness or dizziness.  However, when his wife checked his blood sugar monitor, she was concerned that his sugar was running in the 300-range.  This is very unusual for him so they called EMS to have him evaluated.  The patient said he feels fine right now and has no specific complaints.  His medication regimen has not changed recently.     Physical Exam   Triage Vital Signs: ED Triage Vitals  Enc Vitals Group     BP 04/14/23 2329 (!) 102/50     Pulse Rate 04/14/23 2329 71     Resp 04/14/23 2329 12     Temp 04/14/23 2329 98.4 F (36.9 C)     Temp Source 04/14/23 2329 Oral     SpO2 04/14/23 2325 96 %     Weight 04/14/23 2329 78.9 kg (173 lb 15.1 oz)     Height 04/14/23 2329 1.93 m (6\' 4" )     Head Circumference --      Peak Flow --      Pain Score 04/14/23 2329 0     Pain Loc --      Pain Edu? --      Excl. in [place]? --     Most recent vital signs: Vitals:   04/15/23 0134 04/15/23 0200  BP: (!) 105/56 (!) 97/43  Pulse:    Resp: 13 19  Temp:    SpO2:      General: Awake, no distress.  Elderly and appears somewhat chronically ill but is communicative, alert,  and in good spirits at this time. CV:  Good peripheral perfusion.  Regular rate and rhythm. Resp:  Normal effort. Speaking easily and comfortably, no accessory muscle usage nor intercostal retractions.  Lungs are clear to auscultation bilaterally. Abd:  No distention.  No tenderness to palpation of the abdomen. Other:  Hard of hearing but otherwise good interactions, no focal neurological deficits appreciated.   ED Results / Procedures / Treatments   Labs (all labs ordered are listed, but only abnormal results are displayed) Labs Reviewed  CBC WITH DIFFERENTIAL/PLATELET - Abnormal; Notable for the following components:      Result Value   WBC 16.9 (*)    RBC 4.10 (*)    Hemoglobin 12.3 (*)    HCT 38.5 (*)    Neutro Abs 14.0 (*)    Monocytes Absolute 1.2 (*)    All other components within normal limits  BASIC METABOLIC PANEL - Abnormal; Notable for the following components:   Sodium 127 (*)    Chloride 97 (*)    CO2  20 (*)    Glucose, Bld 334 (*)    BUN 36 (*)    Creatinine, Ser 1.87 (*)    Calcium 7.9 (*)    GFR, Estimated 35 (*)    All other components within normal limits  URINALYSIS, ROUTINE W REFLEX MICROSCOPIC - Abnormal; Notable for the following components:   Color, Urine STRAW (*)    APPearance CLEAR (*)    Glucose, UA >=500 (*)    All other components within normal limits  CBG MONITORING, ED - Abnormal; Notable for the following components:   Glucose-Capillary 295 (*)    All other components within normal limits      RADIOLOGY I viewed and interpreted the patient's 1 view chest x-ray and I see no evidence of pneumonia nor pulmonary edema.  I viewed and interpreted the patient's   PROCEDURES:  Critical Care performed: No  .1-3 Lead EKG Interpretation  Performed by: Loleta Rose, MD Authorized by: Loleta Rose, MD     Interpretation: normal     ECG rate:  70   ECG rate assessment: normal     Rhythm: sinus rhythm     Ectopy: none     Conduction:  normal       IMPRESSION / MDM / ASSESSMENT AND PLAN / ED COURSE  I reviewed the triage vital signs and the nursing notes.                              Differential diagnosis includes, but is not limited to, metabolic or electrolyte abnormality, viral illness, dehydration, DKA, UTI, pneumonia, aspiration.  Patient's presentation is most consistent with acute presentation with potential threat to life or bodily function.  Labs/studies ordered: CBC with differential, urinalysis, BMP, CBG, 1 view chest x-ray.  Interventions/Medications given:  Medications  sodium chloride 0.9 % bolus 1,000 mL (0 mLs Intravenous Stopped 04/15/23 0202)    (Note:  hospital course my include additional interventions and/or labs/studies not listed above.)   Patient had some intermittent episodes of low blood pressure but I think that this may have been positional and somewhat due to his body habitus.  This patient is a good example of someone who looks better in person than his lab results look.  He has a mild AKI and mild hyponatremia (some degree of pseudohyponatremia due to the hyperglycemia) and a mildly decreased CO2, but he has no source of infection that I can identify.  He has no tenderness to palpation of the abdomen in spite of the recent gastroenteritis.  He has a clean urine and no evidence of pneumonia on chest x-ray.  CBC shows a leukocytosis of 16.9 but I suspect this has to do with the vomiting and diarrhea and likely the mild volume depletion.  In spite of the abnormalities, he is awake, alert, pleasant, interactive, joking with me, talking about Father's Day plans, and has no tenderness to palpation of the abdomen and clear lungs.  I considered hospitalization but given his overall well appearance and reassuring workup and spite of the abnormalities described above, I do not think it would be beneficial to him to keep him in the hospital.  I talked with his family and explained that we provided a  liter of fluids but he has a normal anion gap and no evidence of DKA or HHS.  They know to bring him back to the emergency department if he develops new or worsening symptoms but he is  tolerating oral intake, has not had vomiting or diarrhea in more than 12 hours, and does not even have any ongoing nausea.  He denies all symptoms currently.  He does worry be simply from the perspective of being 82 year old's old with symptoms and labs as described, but I cannot make a strong argument for hospitalization where he may be subject to a nosocomial infection or delirium, and there is no clear reason to keep him given that he is tolerating oral intake and is asymptomatic currently.  I encouraged outpatient rehydration, diet as tolerated, close outpatient follow-up, and immediate return to the ED if he develops new or worsening symptoms.  He and his family agree with the plan.  The patient is on the cardiac monitor to evaluate for evidence of arrhythmia and/or significant heart rate changes.   Clinical Course as of 04/15/23 0300  Sun Apr 15, 2023  0300 Of note, I provided a prescription for Zofran in case he develops some additional nausea once they get home. [CF]    Clinical Course User Index [CF] Loleta Rose, MD     FINAL CLINICAL IMPRESSION(S) / ED DIAGNOSES   Final diagnoses:  Hyperglycemia  Nausea vomiting and diarrhea     Rx / DC Orders   ED Discharge Orders          Ordered    ondansetron (ZOFRAN-ODT) 4 MG disintegrating tablet        04/15/23 0210             Note:  This document was prepared using Dragon voice recognition software and may include unintentional dictation errors.   Loleta Rose, MD 04/15/23 0300    Loleta Rose, MD 04/15/23 0300
